# Patient Record
Sex: Female | Born: 1965
Health system: Southern US, Community
[De-identification: ages and names within clinical notes are randomized; demographics above are authoritative.]

## PROBLEM LIST (undated history)

## (undated) DIAGNOSIS — T8859XA Other complications of anesthesia, initial encounter: Secondary | ICD-10-CM

## (undated) DIAGNOSIS — B001 Herpesviral vesicular dermatitis: Secondary | ICD-10-CM

## (undated) DIAGNOSIS — R002 Palpitations: Secondary | ICD-10-CM

## (undated) DIAGNOSIS — G43109 Migraine with aura, not intractable, without status migrainosus: Secondary | ICD-10-CM

## (undated) DIAGNOSIS — F909 Attention-deficit hyperactivity disorder, unspecified type: Secondary | ICD-10-CM

## (undated) DIAGNOSIS — N63 Unspecified lump in unspecified breast: Secondary | ICD-10-CM

## (undated) DIAGNOSIS — F329 Major depressive disorder, single episode, unspecified: Secondary | ICD-10-CM

## (undated) DIAGNOSIS — F1011 Alcohol abuse, in remission: Secondary | ICD-10-CM

## (undated) DIAGNOSIS — I82890 Acute embolism and thrombosis of other specified veins: Secondary | ICD-10-CM

## (undated) DIAGNOSIS — R200 Anesthesia of skin: Secondary | ICD-10-CM

## (undated) DIAGNOSIS — T4145XA Adverse effect of unspecified anesthetic, initial encounter: Secondary | ICD-10-CM

## (undated) DIAGNOSIS — J189 Pneumonia, unspecified organism: Secondary | ICD-10-CM

## (undated) DIAGNOSIS — G250 Essential tremor: Secondary | ICD-10-CM

## (undated) DIAGNOSIS — R51 Headache: Secondary | ICD-10-CM

## (undated) DIAGNOSIS — G459 Transient cerebral ischemic attack, unspecified: Secondary | ICD-10-CM

## (undated) DIAGNOSIS — R32 Unspecified urinary incontinence: Secondary | ICD-10-CM

## (undated) DIAGNOSIS — T7840XA Allergy, unspecified, initial encounter: Secondary | ICD-10-CM

## (undated) DIAGNOSIS — F419 Anxiety disorder, unspecified: Secondary | ICD-10-CM

## (undated) DIAGNOSIS — F32A Depression, unspecified: Secondary | ICD-10-CM

## (undated) DIAGNOSIS — E785 Hyperlipidemia, unspecified: Secondary | ICD-10-CM

## (undated) DIAGNOSIS — D219 Benign neoplasm of connective and other soft tissue, unspecified: Secondary | ICD-10-CM

## (undated) DIAGNOSIS — R519 Headache, unspecified: Secondary | ICD-10-CM

## (undated) DIAGNOSIS — R21 Rash and other nonspecific skin eruption: Secondary | ICD-10-CM

## (undated) DIAGNOSIS — N939 Abnormal uterine and vaginal bleeding, unspecified: Secondary | ICD-10-CM

## (undated) HISTORY — PX: BREAST SURGERY: SHX581

## (undated) HISTORY — DX: Abnormal uterine and vaginal bleeding, unspecified: N93.9

## (undated) HISTORY — DX: Herpesviral vesicular dermatitis: B00.1

## (undated) HISTORY — PX: OTHER SURGICAL HISTORY: SHX169

## (undated) HISTORY — PX: NASAL SEPTUM SURGERY: SHX37

## (undated) HISTORY — DX: Migraine with aura, not intractable, without status migrainosus: G43.109

## (undated) HISTORY — DX: Anxiety disorder, unspecified: F41.9

## (undated) HISTORY — DX: Unspecified urinary incontinence: R32

## (undated) HISTORY — PX: TONSILLECTOMY: SUR1361

## (undated) HISTORY — PX: LIPOMA EXCISION: SHX5283

## (undated) HISTORY — DX: Allergy, unspecified, initial encounter: T78.40XA

## (undated) HISTORY — PX: BREAST BIOPSY: SHX20

## (undated) HISTORY — DX: Benign neoplasm of connective and other soft tissue, unspecified: D21.9

## (undated) HISTORY — PX: COLONOSCOPY: SHX174

## (undated) HISTORY — PX: BREAST CYST ASPIRATION: SHX578

---
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
## 2014-03-20 ENCOUNTER — Ambulatory Visit: Payer: Self-pay

## 2014-11-07 DIAGNOSIS — J189 Pneumonia, unspecified organism: Secondary | ICD-10-CM

## 2014-11-07 HISTORY — DX: Pneumonia, unspecified organism: J18.9

## 2015-04-15 ENCOUNTER — Other Ambulatory Visit: Payer: Self-pay

## 2015-04-15 DIAGNOSIS — R2232 Localized swelling, mass and lump, left upper limb: Secondary | ICD-10-CM

## 2015-04-16 ENCOUNTER — Ambulatory Visit
Admission: RE | Admit: 2015-04-16 | Discharge: 2015-04-16 | Disposition: A | Discharge status: Home / Self Care | Payer: 59 | Source: Ambulatory Visit | Attending: General Surgery | Admitting: General Surgery

## 2015-04-16 ENCOUNTER — Other Ambulatory Visit: Payer: Self-pay | Admitting: *Deleted

## 2015-04-16 DIAGNOSIS — R2232 Localized swelling, mass and lump, left upper limb: Secondary | ICD-10-CM | POA: Insufficient documentation

## 2015-04-22 ENCOUNTER — Other Ambulatory Visit: Payer: Self-pay

## 2015-04-22 ENCOUNTER — Ambulatory Visit: Payer: Self-pay | Admitting: Surgery

## 2015-04-22 ENCOUNTER — Other Ambulatory Visit: Payer: Self-pay | Admitting: *Deleted

## 2015-04-22 DIAGNOSIS — R2232 Localized swelling, mass and lump, left upper limb: Secondary | ICD-10-CM

## 2015-04-29 ENCOUNTER — Ambulatory Visit
Admission: RE | Admit: 2015-04-29 | Discharge: 2015-04-29 | Disposition: A | Discharge status: Home / Self Care | Payer: 59 | Source: Ambulatory Visit | Attending: General Surgery | Admitting: General Surgery

## 2015-04-29 DIAGNOSIS — R2232 Localized swelling, mass and lump, left upper limb: Secondary | ICD-10-CM | POA: Diagnosis present

## 2015-04-29 DIAGNOSIS — N63 Unspecified lump in breast: Secondary | ICD-10-CM | POA: Insufficient documentation

## 2015-04-29 DIAGNOSIS — D179 Benign lipomatous neoplasm, unspecified: Secondary | ICD-10-CM | POA: Diagnosis not present

## 2015-04-29 DIAGNOSIS — M19012 Primary osteoarthritis, left shoulder: Secondary | ICD-10-CM | POA: Insufficient documentation

## 2015-04-29 MED ORDER — GADOBENATE DIMEGLUMINE 529 MG/ML IV SOLN
15.0000 mL | Freq: Once | INTRAVENOUS | Status: AC | PRN
  Administered 2015-04-29: 11 mL via INTRAVENOUS

## 2015-05-06 ENCOUNTER — Encounter (HOSPITAL_COMMUNITY)
Admission: RE | Admit: 2015-05-06 | Discharge: 2015-05-06 | Disposition: A | Discharge status: Home / Self Care | Payer: 59 | Source: Ambulatory Visit | Attending: General Surgery | Admitting: General Surgery

## 2015-05-06 ENCOUNTER — Encounter (HOSPITAL_COMMUNITY): Payer: Self-pay

## 2015-05-06 ENCOUNTER — Other Ambulatory Visit (HOSPITAL_COMMUNITY): Payer: Self-pay | Admitting: *Deleted

## 2015-05-06 DIAGNOSIS — Z79899 Other long term (current) drug therapy: Secondary | ICD-10-CM | POA: Diagnosis not present

## 2015-05-06 DIAGNOSIS — R2232 Localized swelling, mass and lump, left upper limb: Secondary | ICD-10-CM | POA: Diagnosis present

## 2015-05-06 DIAGNOSIS — D1722 Benign lipomatous neoplasm of skin and subcutaneous tissue of left arm: Secondary | ICD-10-CM | POA: Diagnosis not present

## 2015-05-06 DIAGNOSIS — G43909 Migraine, unspecified, not intractable, without status migrainosus: Secondary | ICD-10-CM | POA: Diagnosis not present

## 2015-05-06 DIAGNOSIS — D171 Benign lipomatous neoplasm of skin and subcutaneous tissue of trunk: Secondary | ICD-10-CM | POA: Diagnosis not present

## 2015-05-06 DIAGNOSIS — F329 Major depressive disorder, single episode, unspecified: Secondary | ICD-10-CM | POA: Diagnosis not present

## 2015-05-06 HISTORY — DX: Adverse effect of unspecified anesthetic, initial encounter: T41.45XA

## 2015-05-06 HISTORY — DX: Pneumonia, unspecified organism: J18.9

## 2015-05-06 HISTORY — DX: Other complications of anesthesia, initial encounter: T88.59XA

## 2015-05-06 HISTORY — DX: Major depressive disorder, single episode, unspecified: F32.9

## 2015-05-06 HISTORY — DX: Alcohol abuse, in remission: F10.11

## 2015-05-06 HISTORY — DX: Attention-deficit hyperactivity disorder, unspecified type: F90.9

## 2015-05-06 HISTORY — DX: Headache, unspecified: R51.9

## 2015-05-06 HISTORY — DX: Headache: R51

## 2015-05-06 HISTORY — DX: Depression, unspecified: F32.A

## 2015-05-06 LAB — CBC
HCT: 39.6 % (ref 36.0–46.0)
HEMOGLOBIN: 13.3 g/dL (ref 12.0–15.0)
MCH: 31.4 pg (ref 26.0–34.0)
MCHC: 33.6 g/dL (ref 30.0–36.0)
MCV: 93.6 fL (ref 78.0–100.0)
PLATELETS: 201 10*3/uL (ref 150–400)
RBC: 4.23 MIL/uL (ref 3.87–5.11)
RDW: 12.6 % (ref 11.5–15.5)
WBC: 3.4 10*3/uL — AB (ref 4.0–10.5)

## 2015-05-06 LAB — HCG, SERUM, QUALITATIVE: Preg, Serum: NEGATIVE

## 2015-05-06 NOTE — Pre-Procedure Instructions (Signed)
Jessica Dodson  05/06/2015     Your procedure is scheduled on Friday, May 08, 2015 at 11:15 AM.   Report to [facility] Entrance "A" Admitting Office at 9:15 AM.   Call this number if you have problems the morning of surgery: [phone]     Remember:  Do not eat food or drink liquids after midnight Thursday, 05/07/15.  Take these medicines the morning of surgery with A SIP OF WATER: None   Do not wear jewelry, make-up or nail polish.  Do not wear lotions, powders, or perfumes.  You may NOT wear deodorant.  Do not shave 48 hours prior to surgery.    Do not bring valuables to the hospital.  [facility] is not responsible for any belongings or valuables.  Contacts, dentures or bridgework may not be worn into surgery.  Leave your suitcase in the car.  After surgery it may be brought to your room.  For patients admitted to the hospital, discharge time will be determined by your treatment team.  Patients discharged the day of surgery will not be allowed to drive home.   Special instructions:  [place] - Preparing for Surgery  Before surgery, you can play an important role.  Because skin is not sterile, your skin needs to be as free of germs as possible.  You can reduce the number of germs on you skin by washing with CHG (chlorahexidine gluconate) soap before surgery.  CHG is an antiseptic cleaner which kills germs and bonds with the skin to continue killing germs even after washing.  Please DO NOT use if you have an allergy to CHG or antibacterial soaps.  If your skin becomes reddened/irritated stop using the CHG and inform your nurse when you arrive at Short Stay.  Do not shave (including legs and underarms) for at least 48 hours prior to the first CHG shower.  You may shave your face.  Please follow these instructions carefully:   1.  Shower with CHG Soap the night before surgery and the                                morning of Surgery.  2.  If you choose to wash  your hair, wash your hair first as usual with your       normal shampoo.  3.  After you shampoo, rinse your hair and body thoroughly to remove the                      Shampoo.  4.  Use CHG as you would any other liquid soap.  You can apply chg directly       to the skin and wash gently with scrungie or a clean washcloth.  5.  Apply the CHG Soap to your body ONLY FROM THE NECK DOWN.        Do not use on open wounds or open sores.  Avoid contact with your eyes, ears, mouth and genitals (private parts).  Wash genitals (private parts) with your normal soap.  6.  Wash thoroughly, paying special attention to the area where your surgery        will be performed.  7.  Thoroughly rinse your body with warm water from the neck down.  8.  DO NOT shower/wash with your normal soap after using and rinsing off       the CHG Soap.  9.  Pat yourself dry with a clean towel.            10.  Wear clean pajamas.            11.  Place clean sheets on your bed the night of your first shower and do not        sleep with pets.  Day of Surgery  Do not apply any lotions/deodorants the morning of surgery.  Please wear clean clothes to the hospital.    Please read over the following fact sheets that you were given. Pain Booklet, Coughing and Deep Breathing and Surgical Site Infection Prevention

## 2015-05-06 NOTE — Progress Notes (Signed)
Called Dr. Johney Frame office to request pre-op orders, spoke with Sunday Spillers.

## 2015-05-07 ENCOUNTER — Ambulatory Visit: Payer: Self-pay | Admitting: General Surgery

## 2015-05-07 MED ORDER — CEFAZOLIN SODIUM-DEXTROSE 2-3 GM-% IV SOLR
2.0000 g | INTRAVENOUS | Status: AC
Start: 2015-05-08 — End: 2015-05-08
  Administered 2015-05-08: 2 g via INTRAVENOUS
  Filled 2015-05-07: qty 50

## 2015-05-07 NOTE — H&P (Signed)
History of Present Illness Jessica Ok MD; 04/15/2015 4:12 PM) Patient words: Evaluate mass on Back of Deltoid.  The patient is a 49 year old female who presents with a complaint of Mass. 49 year old female who self-referred for evaluation of a left posterior deltoid mass. Patient states that she noticed this approximately 2 weeks ago. She states it is becoming achy and states that she did not feel that in the past. Patient does state that she has lipomata left lower back area which has not grown in size and is been there for approximately 20 years.   Other Problems [name], [name]; 04/15/2015 4:05 PM) Alcohol Abuse Depression Lump In Breast Migraine Headache  Past Surgical History [name], [name]; 04/15/2015 4:05 PM) Breast Biopsy Right. Oral Surgery Tonsillectomy  Diagnostic Studies History [name], [name]; 04/15/2015 4:05 PM) Colonoscopy 1-5 years ago Mammogram within last year Pap Smear 1-5 years ago  Allergies [name], [name]; 04/15/2015 4:06 PM) No Known Drug Allergies06/06/2015  Medication History [name], [name]; 04/15/2015 4:07 PM) BuPROPion HCl ER (XL) (150MG  Tablet ER 24HR, Oral) Active. Citalopram Hydrobromide (10MG  Tablet, Oral) Active.  Social History [name], [name]; 04/15/2015 4:05 PM) Alcohol use Remotely quit alcohol use. Caffeine use Carbonated beverages, Coffee, Tea. No drug use Tobacco use Never smoker.  Family History [name], [name]; 04/15/2015 4:05 PM) Arthritis Mother. Breast Cancer Family Members In General. Cancer Family Members In General. Cerebrovascular Accident Family Members In [name] Members In General. Colon Polyps Father, Mother. Depression Family Members In General, Sister. Diabetes Mellitus Family Members In General. Hypertension Family Members In General, Father, Mother. Melanoma Family Members In General. Prostate Cancer Family Members In General, Father. Thyroid  problems Father.  Pregnancy / Birth History [name], [name]; 04/15/2015 4:05 PM) Age at menarche 45 years. Gravida 1 Maternal age 46-25 Para 0 Regular periods  Review of Systems [name]; 04/15/2015 4:06 PM) General Not Present- Appetite Loss, Chills, Fatigue, Fever, Night Sweats, Weight Gain and Weight Loss. Skin Not Present- Change in Wart/Mole, Dryness, Hives, Jaundice, New Lesions, Non-Healing Wounds, Rash and Ulcer. HEENT Present- Seasonal Allergies and Wears glasses/contact lenses. Not Present- Earache, Hearing Loss, Hoarseness, Nose Bleed, Oral Ulcers, Ringing in the Ears, Sinus Pain, Sore Throat, Visual Disturbances and Yellow Eyes. Respiratory Not Present- Bloody sputum, Chronic Cough, Difficulty Breathing, Snoring and Wheezing. Breast Not Present- Breast Mass, Breast Pain, Nipple Discharge and Skin Changes. Cardiovascular Not Present- Chest Pain, Difficulty Breathing Lying Down, Leg Cramps, Palpitations, Rapid Heart Rate, Shortness of Breath and Swelling of Extremities. Gastrointestinal Not Present- Abdominal Pain, Bloating, Bloody Stool, Change in Bowel Habits, Chronic diarrhea, Constipation, Difficulty Swallowing, Excessive gas, Gets full quickly at meals, Hemorrhoids, Indigestion, Nausea, Rectal Pain and Vomiting. Female Genitourinary Not Present- Frequency, Nocturia, Painful Urination, Pelvic Pain and Urgency. Musculoskeletal Not Present- Back Pain, Joint Pain, Joint Stiffness, Muscle Pain, Muscle Weakness and Swelling of Extremities. Neurological Not Present- Decreased Memory, Fainting, Headaches, Numbness, Seizures, Tingling, Tremor, Trouble walking and Weakness. Psychiatric Not Present- Anxiety, Bipolar, Change in Sleep Pattern, Depression, Fearful and Frequent crying. Endocrine Not Present- Cold Intolerance, Excessive Hunger, Hair Changes, Heat Intolerance, Hot flashes and New Diabetes. Hematology Not Present- Easy Bruising, Excessive bleeding, Gland problems,  HIV and Persistent Infections.   Vitals [name]; 04/15/2015 4:05 PM) 04/15/2015 4:05 PM Weight: 126.8 lb Height: 63in Body Surface Area: 1.6 m Body Mass Index: 22.46 kg/m Temp.: 97.66F(Temporal)  Pulse: 72 (Regular)  Resp.: 16 (Unlabored)  BP: 128/86 (Sitting, Left Arm,  Standard)    Physical Exam Jessica Ok MD; 04/15/2015 4:13 PM) General Mental Status-Alert. General Appearance-Consistent with stated age. Hydration-Well hydrated. Voice-Normal.  Cardiovascular Cardiovascular examination reveals -normal heart sounds, regular rate and rhythm with no murmurs and normal pedal pulses bilaterally.  Abdomen Inspection Inspection of the abdomen reveals - No Hernias. Skin - Scar - no surgical scars. Palpation/Percussion Palpation and Percussion of the abdomen reveal - Soft, Non Tender, No Rebound tenderness, No Rigidity (guarding) and No hepatosplenomegaly. Auscultation Auscultation of the abdomen reveals - Bowel sounds normal.  Musculoskeletal Note: Left posterior deltoid mass, mobile, spongy     Assessment & Plan Jessica Ok MD; 04/15/2015 4:14 PM) MASS OF SKIN OF SHOULDER, LEFT (782.2  R22.32) Impression: 49 year old female with a left posterior shoulder mass.  WE will proceed to the OR for e/o mass I discussed with the patient the risks and benefits of the procedure to include but not limited to: Infection, bleeding, damage to structures, possible recurrence.  The patient was understanding and wishes to proceed.

## 2015-05-08 ENCOUNTER — Ambulatory Visit (HOSPITAL_COMMUNITY)
Admission: RE | Admit: 2015-05-08 | Discharge: 2015-05-08 | Disposition: A | Discharge status: Home / Self Care | Payer: 59 | Source: Ambulatory Visit | Attending: General Surgery | Admitting: General Surgery

## 2015-05-08 ENCOUNTER — Ambulatory Visit (HOSPITAL_COMMUNITY): Payer: 59 | Admitting: Anesthesiology

## 2015-05-08 ENCOUNTER — Encounter (HOSPITAL_COMMUNITY)
Admission: RE | Disposition: A | Discharge status: Home / Self Care | Payer: Self-pay | Source: Ambulatory Visit | Attending: General Surgery

## 2015-05-08 ENCOUNTER — Encounter (HOSPITAL_COMMUNITY): Payer: Self-pay | Admitting: Surgery

## 2015-05-08 DIAGNOSIS — D1722 Benign lipomatous neoplasm of skin and subcutaneous tissue of left arm: Secondary | ICD-10-CM | POA: Insufficient documentation

## 2015-05-08 DIAGNOSIS — G43909 Migraine, unspecified, not intractable, without status migrainosus: Secondary | ICD-10-CM | POA: Insufficient documentation

## 2015-05-08 DIAGNOSIS — D171 Benign lipomatous neoplasm of skin and subcutaneous tissue of trunk: Secondary | ICD-10-CM | POA: Insufficient documentation

## 2015-05-08 DIAGNOSIS — Z79899 Other long term (current) drug therapy: Secondary | ICD-10-CM | POA: Insufficient documentation

## 2015-05-08 DIAGNOSIS — F329 Major depressive disorder, single episode, unspecified: Secondary | ICD-10-CM | POA: Insufficient documentation

## 2015-05-08 HISTORY — PX: MASS EXCISION: SHX2000

## 2015-05-08 SURGERY — EXCISION MASS
Anesthesia: General | Site: Arm Upper | Laterality: Left

## 2015-05-08 MED ORDER — MIDAZOLAM HCL 5 MG/5ML IJ SOLN
INTRAMUSCULAR | Status: DC | PRN
  Administered 2015-05-08: 2 mg via INTRAVENOUS

## 2015-05-08 MED ORDER — ONDANSETRON HCL 4 MG/2ML IJ SOLN
INTRAMUSCULAR | Status: AC
  Filled 2015-05-08: qty 2

## 2015-05-08 MED ORDER — HYDROMORPHONE HCL 1 MG/ML IJ SOLN: 0.2500 mg | INTRAMUSCULAR | Status: DC | PRN

## 2015-05-08 MED ORDER — BUPIVACAINE-EPINEPHRINE 0.25% -1:200000 IJ SOLN
INTRAMUSCULAR | Status: DC | PRN
  Administered 2015-05-08: 20 mL

## 2015-05-08 MED ORDER — 0.9 % SODIUM CHLORIDE (POUR BTL) OPTIME
TOPICAL | Status: DC | PRN
  Administered 2015-05-08: 1000 mL

## 2015-05-08 MED ORDER — PROMETHAZINE HCL 25 MG/ML IJ SOLN: 6.2500 mg | INTRAMUSCULAR | Status: DC | PRN

## 2015-05-08 MED ORDER — LIDOCAINE HCL (CARDIAC) 20 MG/ML IV SOLN
INTRAVENOUS | Status: AC
  Filled 2015-05-08: qty 5

## 2015-05-08 MED ORDER — CHLORHEXIDINE GLUCONATE 4 % EX LIQD
1.0000 | Freq: Once | CUTANEOUS | Status: DC
Start: 2015-05-09 — End: 2015-05-08

## 2015-05-08 MED ORDER — FENTANYL CITRATE (PF) 100 MCG/2ML IJ SOLN
INTRAMUSCULAR | Status: DC | PRN
  Administered 2015-05-08 (×2): 50 ug via INTRAVENOUS

## 2015-05-08 MED ORDER — LIDOCAINE HCL (CARDIAC) 20 MG/ML IV SOLN
INTRAVENOUS | Status: DC | PRN
  Administered 2015-05-08: 100 mg via INTRAVENOUS

## 2015-05-08 MED ORDER — PROPOFOL 10 MG/ML IV BOLUS
INTRAVENOUS | Status: AC
  Filled 2015-05-08: qty 20

## 2015-05-08 MED ORDER — OXYCODONE-ACETAMINOPHEN 5-325 MG PO TABS: 1.0000 | ORAL_TABLET | ORAL | Status: DC | PRN

## 2015-05-08 MED ORDER — LACTATED RINGERS IV SOLN
INTRAVENOUS | Status: DC
  Administered 2015-05-08 (×2): via INTRAVENOUS

## 2015-05-08 MED ORDER — ARTIFICIAL TEARS OP OINT
TOPICAL_OINTMENT | OPHTHALMIC | Status: AC
  Filled 2015-05-08: qty 3.5

## 2015-05-08 MED ORDER — ONDANSETRON HCL 4 MG/2ML IJ SOLN
INTRAMUSCULAR | Status: DC | PRN
Start: 2015-05-08 — End: 2015-05-08
  Administered 2015-05-08: 4 mg via INTRAVENOUS

## 2015-05-08 MED ORDER — FENTANYL CITRATE (PF) 250 MCG/5ML IJ SOLN
INTRAMUSCULAR | Status: AC
  Filled 2015-05-08: qty 5

## 2015-05-08 MED ORDER — PROPOFOL 10 MG/ML IV BOLUS
INTRAVENOUS | Status: DC | PRN
  Administered 2015-05-08: 200 mg via INTRAVENOUS

## 2015-05-08 MED ORDER — MIDAZOLAM HCL 2 MG/2ML IJ SOLN
INTRAMUSCULAR | Status: AC
  Filled 2015-05-08: qty 2

## 2015-05-08 SURGICAL SUPPLY — 34 items
CHLORAPREP W/TINT 26ML (MISCELLANEOUS) ×2 IMPLANT
COVER SURGICAL LIGHT HANDLE (MISCELLANEOUS) ×2 IMPLANT
DRAPE PED LAPAROTOMY (DRAPES) ×2 IMPLANT
ELECT CAUTERY BLADE 6.4 (BLADE) ×2 IMPLANT
ELECT REM PT RETURN 9FT ADLT (ELECTROSURGICAL) ×2
ELECTRODE REM PT RTRN 9FT ADLT (ELECTROSURGICAL) ×1 IMPLANT
GLOVE BIO SURGEON STRL SZ7 (GLOVE) ×2 IMPLANT
GLOVE BIO SURGEON STRL SZ7.5 (GLOVE) ×2 IMPLANT
GLOVE BIOGEL PI IND STRL 6.5 (GLOVE) ×1 IMPLANT
GLOVE BIOGEL PI IND STRL 7.0 (GLOVE) ×2 IMPLANT
GLOVE BIOGEL PI IND STRL 8 (GLOVE) ×1 IMPLANT
GLOVE BIOGEL PI INDICATOR 6.5 (GLOVE) ×1
GLOVE BIOGEL PI INDICATOR 7.0 (GLOVE) ×2
GLOVE BIOGEL PI INDICATOR 8 (GLOVE) ×1
GLOVE SURG SS PI 7.0 STRL IVOR (GLOVE) ×4 IMPLANT
GOWN STRL REUS W/ TWL LRG LVL3 (GOWN DISPOSABLE) ×1 IMPLANT
GOWN STRL REUS W/ TWL XL LVL3 (GOWN DISPOSABLE) ×1 IMPLANT
GOWN STRL REUS W/TWL LRG LVL3 (GOWN DISPOSABLE) ×1
GOWN STRL REUS W/TWL XL LVL3 (GOWN DISPOSABLE) ×1
KIT BASIN OR (CUSTOM PROCEDURE TRAY) ×2 IMPLANT
KIT ROOM TURNOVER OR (KITS) ×2 IMPLANT
LIQUID BAND (GAUZE/BANDAGES/DRESSINGS) ×2 IMPLANT
NEEDLE HYPO 25GX1X1/2 BEV (NEEDLE) ×2 IMPLANT
NS IRRIG 1000ML POUR BTL (IV SOLUTION) ×2 IMPLANT
PACK SURGICAL SETUP 50X90 (CUSTOM PROCEDURE TRAY) ×2 IMPLANT
PAD ARMBOARD 7.5X6 YLW CONV (MISCELLANEOUS) ×2 IMPLANT
PENCIL BUTTON HOLSTER BLD 10FT (ELECTRODE) ×2 IMPLANT
SPECIMEN JAR SMALL (MISCELLANEOUS) ×2 IMPLANT
SPONGE LAP 18X18 X RAY DECT (DISPOSABLE) ×2 IMPLANT
SUT MNCRL AB 4-0 PS2 18 (SUTURE) ×2 IMPLANT
SUT VIC AB 3-0 SH 18 (SUTURE) ×2 IMPLANT
SYR CONTROL 10ML LL (SYRINGE) ×2 IMPLANT
TOWEL OR 17X26 10 PK STRL BLUE (TOWEL DISPOSABLE) ×2 IMPLANT
TUBE CONNECTING 12X1/4 (SUCTIONS) ×2 IMPLANT

## 2015-05-08 NOTE — Progress Notes (Signed)
   05/08/15 1000  Clinical Encounter Type  Visited With Patient and family together  Visit Type Initial;Pre-op  Advance Directives (For Healthcare)  Does patient have an advance directive? Yes  Type of Paramedic of [place];Living will   Chaplain was paged to patient's room this morning to help facilitate the completion of the patient's advanced directive. When chaplain arrived, patient was accompanied by husband and had completed the advanced directive correctly. Chaplain facilitated finalizing and signing of document. Patient now has an advanced directive, including healthcare power of attorney and living will on file. Chaplain also provided copies for the patient and patient's family.  Gar Ponto, Chaplain  10:43 AM

## 2015-05-08 NOTE — Anesthesia Postprocedure Evaluation (Signed)
Anesthesia Post Note  Patient: Jessica Dodson  Procedure(s) Performed: Procedure(s) (LRB): EXCISION OF LEFT DELTOID MASS (Left)  Anesthesia type: general  Patient location: PACU  Post pain: Pain level controlled  Post assessment: Patient's Cardiovascular Status Stable  Last Vitals:  Filed Vitals:   05/08/15 1330  BP: 126/69  Pulse: 51  Temp:   Resp: 14    Post vital signs: Reviewed and stable  Level of consciousness: sedated  Complications: No apparent anesthesia complications

## 2015-05-08 NOTE — Discharge Instructions (Signed)
Incision Care An incision is when a surgeon cuts into your body tissues. After surgery, the incision needs to be cared for properly to prevent infection.  HOME CARE INSTRUCTIONS   Take all medicine as directed by your caregiver. Only take over-the-counter or prescription medicines for pain, discomfort, or fever as directed by your caregiver.  Do not remove your bandage (dressing) or get your incision wet until your surgeon gives you permission. In the event that your dressing becomes wet, dirty, or starts to smell, change the dressing and call your surgeon for instructions as soon as possible.  Take showers.  Resume your normal diet and activities as directed or allowed.  Avoid lifting any weight until you are instructed otherwise.  Use anti-itch antihistamine medicine as directed by your caregiver. The wound may itch when it is healing. Do not pick or scratch at the wound.  Follow up with your caregiver for stitch (suture) or staple removal as directed.  Drink enough fluids to keep your urine clear or pale yellow. SEEK MEDICAL CARE IF:   You have redness, swelling, or increasing pain in the wound that is not controlled with medicine.  You have drainage, blood, or pus coming from the wound that lasts longer than 1 day.  You develop muscle aches, chills, or a general ill feeling.  You notice a bad smell coming from the wound or dressing.  Your wound edges separate after the sutures, staples, or skin adhesive strips have been removed.  You develop persistent nausea or vomiting. SEEK IMMEDIATE MEDICAL CARE IF:   You have a fever.  You develop a rash.  You develop dizzy episodes or faint while standing.  You have difficulty breathing.  You develop any reaction or side effects to medicine given. MAKE SURE YOU:   Understand these instructions.  Will watch your condition.  Will get help right away if you are not doing well or get worse. Document Released: 05/13/2005  Document Revised: 01/16/2012 Document Reviewed: 12/18/2013 [facility] Patient Information 2015 [place], [place]. This information is not intended to replace advice given to you by your health care provider. Make sure you discuss any questions you have with your health care provider.

## 2015-05-08 NOTE — Op Note (Signed)
05/08/2015  12:46 PM  PATIENT:  Jessica Dodson  49 y.o. female  PRE-OPERATIVE DIAGNOSIS:  LEFT DELTOID MASS  POST-OPERATIVE DIAGNOSIS:  LEFT DELTOID MASS - 6.25 x 4cm  PROCEDURE:  Procedure(s): EXCISION OF LEFT DELTOID MASS, SUBFASCIAL, 6.25 X 4CM (Left)  SURGEON:  Surgeon(s) and Role:    * [name], MD - Primary  ANESTHESIA:   local and general  EBL:   5cc  BLOOD ADMINISTERED:none  DRAINS: none   LOCAL MEDICATIONS USED:  BUPIVICAINE   SPECIMEN:  Source of Specimen:  Left shoulder mass 6.25 x 4cm sub fascial  DISPOSITION OF SPECIMEN:  PATHOLOGY  COUNTS:  YES  TOURNIQUET:  * No tourniquets in log *  DICTATION: .Dragon Dictation After the patient was consented she was taken back to the operating room and placed in supine position with bilateral SCDs in place. She was prepped and draped in the usual sterile fashion. A timeout was called all facts verified.  A 4 same incision was made just over the left deltoid muscle, over the area of the greatest bulge. Blood cutter used to maintain hemostasis and dissection was taken down to the deltoid fascia. This was incised. According to the MRI of the left deltoid mass was subfascial. At this time blunt dissection was taken down to the mass. The mass was visualized and seen to be fatty which correlated with MRI. This was circumferentially dissected away from the surrounding musculature. Once the entire mass was circumferentially dissected this was excised. The mass measured 6.25 x 4 cm.  At this time the area was irrigated out with sterile saline. The fascia was reapproximated using 3-0 Vicryl's in a figure-of-eight fashion. The deep dermal layer was reapproximated using 3-0 Vicryl's in interrupted fashion. The skin was reapproximated using 4-0 Monocryl subcuticular fashion. The area was infiltrated with percent bupivacaine. The wound was dressed with Liqui-Band.  All counts were reported as correct 2.  The patient tolerated the  procedure well, and was taken to the recovery room in stable condition.  PLAN OF CARE: Discharge to home after PACU  PATIENT DISPOSITION:  PACU - hemodynamically stable.   Delay start of Pharmacological VTE agent (>24hrs) due to surgical blood loss or risk of bleeding: not applicable

## 2015-05-08 NOTE — Interval H&P Note (Signed)
History and Physical Interval Note:  05/08/2015 7:47 AM  Jessica Dodson  has presented today for surgery, with the diagnosis of LEFT DELTOID MASS  The various methods of treatment have been discussed with the patient and family. After consideration of risks, benefits and other options for treatment, the patient has consented to  Procedure(s): EXCISION OF LEFT DELTOID MASS (Left) as a surgical intervention .  The patient's history has been reviewed, patient examined, no change in status, stable for surgery.  I have reviewed the patient's chart and labs.  Questions were answered to the patient's satisfaction.     Rosario Jacks., Anne Hahn

## 2015-05-08 NOTE — Anesthesia Procedure Notes (Signed)
Procedure Name: LMA Insertion Date/Time: 05/08/2015 11:59 AM Performed by: Scheryl Darter Pre-anesthesia Checklist: Patient identified, Emergency Drugs available, Suction available, Patient being monitored and Timeout performed Patient Re-evaluated:Patient Re-evaluated prior to inductionOxygen Delivery Method: Circle system utilized Preoxygenation: Pre-oxygenation with 100% oxygen Intubation Type: IV induction Ventilation: Mask ventilation without difficulty LMA: LMA inserted LMA Size: 4.0 Number of attempts: 1 Placement Confirmation: ETT inserted through vocal cords under direct vision,  positive ETCO2 and breath sounds checked- equal and bilateral Tube secured with: Tape

## 2015-05-08 NOTE — Transfer of Care (Signed)
Immediate Anesthesia Transfer of Care Note  Patient: Jessica Dodson  Procedure(s) Performed: Procedure(s): EXCISION OF LEFT DELTOID MASS (Left)  Patient Location: PACU  Anesthesia Type:General  Level of Consciousness: awake, alert , oriented and sedated  Airway & Oxygen Therapy: Patient Spontanous Breathing and Patient connected to nasal cannula oxygen  Post-op Assessment: Report given to RN, Post -op Vital signs reviewed and stable and Patient moving all extremities  Post vital signs: Reviewed and stable  Last Vitals:  Filed Vitals:   05/08/15 1256  BP: 112/55  Pulse: 86  Temp: 36.6 C  Resp: 15    Complications: No apparent anesthesia complications

## 2015-05-08 NOTE — H&P (View-Only) (Signed)
History of Present Illness Jessica Ok Jessica Dodson; 04/15/2015 4:12 PM) Patient words: Evaluate mass on Back of Deltoid.  The patient is a 49 year old female who presents with a complaint of Mass. 49 year old female who self-referred for evaluation of a left posterior deltoid mass. Patient states that she noticed this approximately 2 weeks ago. She states it is becoming achy and states that she did not feel that in the past. Patient does state that she has lipomata left lower back area which has not grown in size and is been there for approximately 20 years.   Other Problems Jessica Dodson, Jessica Dodson; 04/15/2015 4:05 PM) Alcohol Abuse Depression Lump In Breast Migraine Headache  Past Surgical History Jessica Dodson, Jessica Dodson; 04/15/2015 4:05 PM) Breast Biopsy Right. Oral Surgery Tonsillectomy  Diagnostic Studies History Jessica Dodson, Jessica Dodson; 04/15/2015 4:05 PM) Colonoscopy 1-5 years ago Mammogram within last year Pap Smear 1-5 years ago  Allergies Jessica Dodson, Jessica Dodson; 04/15/2015 4:06 PM) No Known Drug Allergies06/06/2015  Medication History Jessica Dodson, Jessica Dodson; 04/15/2015 4:07 PM) BuPROPion HCl ER (XL) (150MG  Tablet ER 24HR, Oral) Active. Citalopram Hydrobromide (10MG  Tablet, Oral) Active.  Social History Jessica Dodson, Jessica Dodson; 04/15/2015 4:05 PM) Alcohol use Remotely quit alcohol use. Caffeine use Carbonated beverages, Coffee, Tea. No drug use Tobacco use Never smoker.  Family History Jessica Dodson, Jessica Dodson; 04/15/2015 4:05 PM) Arthritis Mother. Breast Cancer Family Members In General. Cancer Family Members In General. Cerebrovascular Accident Family Members In [place] Members In General. Colon Polyps Father, Mother. Depression Family Members In General, Sister. Diabetes Mellitus Family Members In General. Hypertension Family Members In General, Father, Mother. Melanoma Family Members In General. Prostate Cancer Family Members In General, Father. Thyroid  problems Father.  Pregnancy / Birth History Jessica Dodson, Jessica Dodson; 04/15/2015 4:05 PM) Age at menarche 39 years. Gravida 1 Maternal age 44-25 Para 0 Regular periods  Review of Systems Jessica Dodson Jessica Dodson; 04/15/2015 4:06 PM) General Not Present- Appetite Loss, Chills, Fatigue, Fever, Night Sweats, Weight Gain and Weight Loss. Skin Not Present- Change in Wart/Mole, Dryness, Hives, Jaundice, New Lesions, Non-Healing Wounds, Rash and Ulcer. HEENT Present- Seasonal Allergies and Wears glasses/contact lenses. Not Present- Earache, Hearing Loss, Hoarseness, Nose Bleed, Oral Ulcers, Ringing in the Ears, Sinus Pain, Sore Throat, Visual Disturbances and Yellow Eyes. Respiratory Not Present- Bloody sputum, Chronic Cough, Difficulty Breathing, Snoring and Wheezing. Breast Not Present- Breast Mass, Breast Pain, Nipple Discharge and Skin Changes. Cardiovascular Not Present- Chest Pain, Difficulty Breathing Lying Down, Leg Cramps, Palpitations, Rapid Heart Rate, Shortness of Breath and Swelling of Extremities. Gastrointestinal Not Present- Abdominal Pain, Bloating, Bloody Stool, Change in Bowel Habits, Chronic diarrhea, Constipation, Difficulty Swallowing, Excessive gas, Gets full quickly at meals, Hemorrhoids, Indigestion, Nausea, Rectal Pain and Vomiting. Female Genitourinary Not Present- Frequency, Nocturia, Painful Urination, Pelvic Pain and Urgency. Musculoskeletal Not Present- Back Pain, Joint Pain, Joint Stiffness, Muscle Pain, Muscle Weakness and Swelling of Extremities. Neurological Not Present- Decreased Memory, Fainting, Headaches, Numbness, Seizures, Tingling, Tremor, Trouble walking and Weakness. Psychiatric Not Present- Anxiety, Bipolar, Change in Sleep Pattern, Depression, Fearful and Frequent crying. Endocrine Not Present- Cold Intolerance, Excessive Hunger, Hair Changes, Heat Intolerance, Hot flashes and New Diabetes. Hematology Not Present- Easy Bruising, Excessive bleeding, Gland problems,  HIV and Persistent Infections.   Vitals Jessica Dodson Jessica Dodson; 04/15/2015 4:05 PM) 04/15/2015 4:05 PM Weight: 126.8 lb Height: 63in Body Surface Area: 1.6 m Body Mass Index: 22.46 kg/m Temp.: 97.12F(Temporal)  Pulse: 72 (Regular)  Resp.: 16 (Unlabored)  BP: 128/86 (Sitting, Left Arm,  Standard)    Physical Exam Jessica Ok Jessica Dodson; 04/15/2015 4:13 PM) General Mental Status-Alert. General Appearance-Consistent with stated age. Hydration-Well hydrated. Voice-Normal.  Cardiovascular Cardiovascular examination reveals -normal heart sounds, regular rate and rhythm with no murmurs and normal pedal pulses bilaterally.  Abdomen Inspection Inspection of the abdomen reveals - No Hernias. Skin - Scar - no surgical scars. Palpation/Percussion Palpation and Percussion of the abdomen reveal - Soft, Non Tender, No Rebound tenderness, No Rigidity (guarding) and No hepatosplenomegaly. Auscultation Auscultation of the abdomen reveals - Bowel sounds normal.  Musculoskeletal Note: Left posterior deltoid mass, mobile, spongy     Assessment & Plan Jessica Ok Jessica Dodson; 04/15/2015 4:14 PM) MASS OF SKIN OF SHOULDER, LEFT (782.2  R22.32) Impression: 49 year old female with a left posterior shoulder mass.  WE will proceed to the OR for e/o mass I discussed with the patient the risks and benefits of the procedure to include but not limited to: Infection, bleeding, damage to structures, possible recurrence.  The patient was understanding and wishes to proceed.

## 2015-05-12 ENCOUNTER — Encounter (HOSPITAL_COMMUNITY): Payer: Self-pay | Admitting: General Surgery

## 2015-06-30 ENCOUNTER — Encounter: Payer: Self-pay | Admitting: Primary Care

## 2015-06-30 ENCOUNTER — Ambulatory Visit (INDEPENDENT_AMBULATORY_CARE_PROVIDER_SITE_OTHER): Payer: 59 | Admitting: Primary Care

## 2015-06-30 ENCOUNTER — Encounter (INDEPENDENT_AMBULATORY_CARE_PROVIDER_SITE_OTHER): Payer: Self-pay

## 2015-06-30 VITALS — BP 114/70 | HR 61 | Temp 98.1°F | Ht 65.0 in | Wt 124.4 lb

## 2015-06-30 DIAGNOSIS — F419 Anxiety disorder, unspecified: Secondary | ICD-10-CM

## 2015-06-30 DIAGNOSIS — F32A Depression, unspecified: Secondary | ICD-10-CM | POA: Insufficient documentation

## 2015-06-30 DIAGNOSIS — F329 Major depressive disorder, single episode, unspecified: Secondary | ICD-10-CM | POA: Diagnosis not present

## 2015-06-30 HISTORY — DX: Depression, unspecified: F32.A

## 2015-06-30 MED ORDER — ESCITALOPRAM OXALATE 10 MG PO TABS: 10.0000 mg | ORAL_TABLET | Freq: Every day | ORAL | Status: DC

## 2015-06-30 NOTE — Progress Notes (Signed)
Subjective:    Patient ID: Jessica Dodson, female    DOB: [birth year], 49 y.o.   MRN: [id number]  HPI  Jessica Dodson is a 49 year old female who presents today to establish care and discuss the problems mentioned below. Will obtain old records. Last physical was in the Fall of 2015.  1) Depression: Diagnosed in college and hospitalized in medical school. She was once on paxil, then switched to lexapro 10 years ago. She was following with PCP who is no longer in network. Denies side effects of the medication, denies SI/HI. Has been sober for 23 years from alcohol.   Review of Systems  Constitutional: Negative for unexpected weight change.  HENT: Negative for rhinorrhea.   Respiratory: Negative for cough and shortness of breath.   Cardiovascular: Negative for chest pain.  Gastrointestinal: Negative for diarrhea and constipation.  Genitourinary: Negative for difficulty urinating.       Periods regular.  Musculoskeletal: Negative for myalgias and arthralgias.       Sees Dr. Ricki Miller in [place] (orthopedist)   Skin: Negative for rash.  Neurological: Negative for dizziness and headaches.       Past Medical History  Diagnosis Date  . Allergy   . Migraine with aura   . Depression     Social History   Social History  . Marital Status: Married    Spouse Name: N/A  . Number of Children: N/A  . Years of Education: N/A   Occupational History  . Not on file.   Social History Main Topics  . Smoking status: Never Smoker   . Smokeless tobacco: Not on file  . Alcohol Use: No  . Drug Use: Not on file  . Sexual Activity: Not on file   Other Topics Concern  . Not on file   Social History Narrative   Married.   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    Live in [place].    Enjoys running, mountain bike, kayak.     Past Surgical History  Procedure Laterality Date  . Lipoma excision Left     6 cm tumor    Family History  Problem Relation Age of Onset  .  Arthritis Mother   . Hyperlipidemia Mother   . Hypertension Mother   . Prostate cancer Father   . Hyperlipidemia Father   . Hypertension Father   . Prostate cancer Maternal Uncle   . Colon cancer Maternal Uncle   . Breast cancer Paternal Aunt   . Diabetes Paternal Uncle   . Arthritis Maternal Grandmother   . Stroke Maternal Grandmother   . Hypertension Maternal Grandmother   . Mental illness Maternal Grandmother   . Hypertension Maternal Grandfather   . Hyperlipidemia Paternal Grandmother   . Stroke Paternal Grandmother   . Prostate cancer Paternal Grandfather   . Hyperlipidemia Paternal Grandfather   . Heart disease Paternal Grandfather   . Hypertension Paternal Grandfather   . Prostate cancer Maternal Uncle   . Breast cancer Paternal Aunt     No Known Allergies  No current outpatient prescriptions on file prior to visit.   No current facility-administered medications on file prior to visit.    BP 114/70 mmHg  Pulse 61  Temp(Src) 98.1 F (36.7 C) (Oral)  Ht 5\' 5"  (1.651 m)  Wt 124 lb 6.4 oz (56.427 kg)  BMI 20.70 kg/m2  SpO2 98%  LMP 06/08/2015    Objective:   Physical Exam  Constitutional:  She is oriented to person, place, and time. She appears well-nourished.  Cardiovascular: Normal rate and regular rhythm.   Pulmonary/Chest: Effort normal and breath sounds normal.  Neurological: She is alert and oriented to person, place, and time.  Skin: Skin is warm and dry.  Psychiatric: She has a normal mood and affect.          Assessment & Plan:

## 2015-06-30 NOTE — Patient Instructions (Signed)
Please schedule a physical with me in the next 3 months. You will also schedule a lab only appointment one week prior. We will discuss your lab results during your physical.  Refills have been sent for your Lexapro.  It was a pleasure to meet you today! Please don't hesitate to call me with any questions. Welcome to [facility]!

## 2015-06-30 NOTE — Assessment & Plan Note (Signed)
Longstanding history since college years. Managed on Lexapro 10 mg for the past 10 years. Denies side effects, SI/HI, and feels well managed. Refills provided today. Will continue to monitor.

## 2015-07-08 ENCOUNTER — Encounter (HOSPITAL_COMMUNITY): Payer: Self-pay | Admitting: General Surgery

## 2015-09-15 ENCOUNTER — Other Ambulatory Visit: Payer: Self-pay | Admitting: Primary Care

## 2015-09-15 DIAGNOSIS — Z0000 Encounter for general adult medical examination without abnormal findings: Secondary | ICD-10-CM

## 2015-09-15 DIAGNOSIS — E538 Deficiency of other specified B group vitamins: Secondary | ICD-10-CM

## 2015-09-22 ENCOUNTER — Other Ambulatory Visit (INDEPENDENT_AMBULATORY_CARE_PROVIDER_SITE_OTHER): Payer: 59

## 2015-09-22 DIAGNOSIS — Z0000 Encounter for general adult medical examination without abnormal findings: Secondary | ICD-10-CM | POA: Diagnosis not present

## 2015-09-22 DIAGNOSIS — E538 Deficiency of other specified B group vitamins: Secondary | ICD-10-CM

## 2015-09-22 LAB — COMPREHENSIVE METABOLIC PANEL
ALBUMIN: 4.4 g/dL (ref 3.5–5.2)
ALT: 11 U/L (ref 0–35)
AST: 16 U/L (ref 0–37)
Alkaline Phosphatase: 52 U/L (ref 39–117)
BILIRUBIN TOTAL: 0.9 mg/dL (ref 0.2–1.2)
BUN: 10 mg/dL (ref 6–23)
CALCIUM: 9.1 mg/dL (ref 8.4–10.5)
CHLORIDE: 101 meq/L (ref 96–112)
CO2: 29 mEq/L (ref 19–32)
CREATININE: 0.63 mg/dL (ref 0.40–1.20)
GFR: 107.04 mL/min (ref >60.00)
Glucose, Bld: 86 mg/dL (ref 70–99)
Potassium: 3.8 mEq/L (ref 3.5–5.1)
Sodium: 137 mEq/L (ref 135–145)
Total Protein: 7.4 g/dL (ref 6.0–8.3)

## 2015-09-22 LAB — LIPID PANEL
CHOLESTEROL: 152 mg/dL (ref 0–200)
HDL: 60.2 mg/dL (ref >39.00)
LDL Cholesterol: 81 mg/dL (ref 0–99)
NonHDL: 92.05
TRIGLYCERIDES: 53 mg/dL (ref 0.0–149.0)
Total CHOL/HDL Ratio: 3
VLDL: 10.6 mg/dL (ref 0.0–40.0)

## 2015-09-22 LAB — VITAMIN B12: VITAMIN B 12: 261 pg/mL (ref 211–911)

## 2015-09-22 LAB — VITAMIN D 25 HYDROXY (VIT D DEFICIENCY, FRACTURES): VITD: 32.88 ng/mL (ref 30.00–100.00)

## 2015-09-29 ENCOUNTER — Encounter: Payer: 59 | Admitting: Primary Care

## 2015-10-12 ENCOUNTER — Encounter: Payer: Self-pay | Admitting: Primary Care

## 2015-10-12 ENCOUNTER — Ambulatory Visit (INDEPENDENT_AMBULATORY_CARE_PROVIDER_SITE_OTHER): Payer: 59 | Admitting: Primary Care

## 2015-10-12 VITALS — BP 122/78 | HR 59 | Temp 97.3°F | Ht 65.0 in | Wt 126.1 lb

## 2015-10-12 DIAGNOSIS — N63 Unspecified lump in unspecified breast: Secondary | ICD-10-CM

## 2015-10-12 HISTORY — DX: Unspecified lump in unspecified breast: N63.0

## 2015-10-12 NOTE — Assessment & Plan Note (Addendum)
Present x 1 month, increase in size and tenderness Located to left breast at 2-3 o'clock position. No changes in skin texture, nipple appears WNL. Right WNL. Firm, immobile, non tender. Will order diagnostic mammogram with [place] to left breast. Anticipate biopsy will be needed, she would like to go to her specialist in [place].

## 2015-10-12 NOTE — Progress Notes (Signed)
Subjective:    Patient ID: Jessica Dodson, female    DOB: 1966-04-15, 49 y.o.   MRN: [id number]  HPI  Jessica Dodson is a 49 year old female who presents today with a chief complaint of breast mass. Her mass is located to the left breast and is the size of a marble. She reports tenderness. She first noticed the mass 1 month ago and has since increased in tenderness and size. She placed a needle into her mass without drainage. She's had a history of cysts to her breasts prior and last diagnotic mammogram was in October 2015. Denies skin texture changes, erythema, nipple inversion, skin dimpling.   Review of Systems  Constitutional: Negative for fever.  Respiratory:       Left breast mass and tenderness  Skin: Negative for color change and rash.       Past Medical History  Diagnosis Date  . Pneumonia 2016  . Depression     recurrent, not on meds at this time  . Headache     migraine (once a year) usually has an aura  . Complication of anesthesia     hard to wake up after tonsillectomy and nasoseptoplasty  . H/O alcohol abuse     19 years sober  . ADHD (attention deficit hyperactivity disorder)   . Allergy   . Migraine with aura   . Depression     Social History   Social History  . Marital Status: Married    Spouse Name: N/A  . Number of Children: N/A  . Years of Education: N/A   Occupational History  . Not on file.   Social History Main Topics  . Smoking status: Never Smoker   . Smokeless tobacco: Not on file  . Alcohol Use: No     Comment: sober for 19 years  . Drug Use: No  . Sexual Activity: Not on file   Other Topics Concern  . Not on file   Social History Narrative   ** Merged History Encounter **       ** Data from: 05/12/15 Enc Dept: [facility]       ** Data from: 06/30/15 Enc Dept: [facility]   Married. 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  Live in [place].  Enjoys running, mountain bike, kayak.     Past  Surgical History  Procedure Laterality Date  . Urethral stricture surgery      at age 86  . Tonsillectomy    . Colonoscopy    . Breast surgery Right     biopsy - benign  . Nasal septum surgery    . Mass excision Left 05/08/2015    Procedure: EXCISION OF LEFT DELTOID MASS;  Surgeon: [name], MD;  Location: [facility];  Service: General;  Laterality: Left;  . Lipoma excision Left     6 cm tumor    Family History  Problem Relation Age of Onset  . Obesity Mother   . High Cholesterol Mother   . High Cholesterol Father   . Hypothyroidism Father   . Arthritis Mother   . Hyperlipidemia Mother   . Hypertension Mother   . Prostate cancer Father   . Hyperlipidemia Father   . Hypertension Father   . Prostate cancer Maternal Uncle   . Colon cancer Maternal Uncle   . Breast cancer Paternal Aunt   . Diabetes Paternal Uncle   . Arthritis Maternal Grandmother   . Stroke Maternal Grandmother   .  Hypertension Maternal Grandmother   . Mental illness Maternal Grandmother   . Hypertension Maternal Grandfather   . Hyperlipidemia Paternal Grandmother   . Stroke Paternal Grandmother   . Prostate cancer Paternal Grandfather   . Hyperlipidemia Paternal Grandfather   . Heart disease Paternal Grandfather   . Hypertension Paternal Grandfather   . Prostate cancer Maternal Uncle   . Breast cancer Paternal Aunt     No Known Allergies  Current Outpatient Prescriptions on File Prior to Visit  Medication Sig Dispense Refill  . aspirin 81 MG tablet Take 81 mg by mouth. Take 1 PO 4-5 days/week    . Cholecalciferol (VITAMIN D3) 5000 UNITS TABS Take 5,000 Units by mouth once a week.    . escitalopram (LEXAPRO) 10 MG tablet Take 1 tablet (10 mg total) by mouth daily. 30 tablet 5  . vitamin B-12 (CYANOCOBALAMIN) 500 MCG tablet Take 500 mcg by mouth daily. 1 Tablet Po 4-5 days/week    . oxyCODONE-acetaminophen (ROXICET) 5-325 MG per tablet Take 1-2 tablets by mouth every 4 (four) hours as needed. (Patient  not taking: Reported on 10/12/2015) 30 tablet 0   No current facility-administered medications on file prior to visit.    BP 122/78 mmHg  Pulse 59  Temp(Src) 97.3 F (36.3 C) (Oral)  Ht 5\' 5"  (1.651 m)  Wt 126 lb 1.9 oz (57.208 kg)  BMI 20.99 kg/m2  SpO2 97%  LMP 09/21/2015    Objective:   Physical Exam  Constitutional: She appears well-nourished.  Cardiovascular: Normal rate and regular rhythm.   Pulmonary/Chest: Effort normal and breath sounds normal. Left breast exhibits mass and tenderness. Left breast exhibits no inverted nipple, no nipple discharge and no skin change.  Mass to left breast at 2-3 o'clock position. 1 cm in size, immobile, non tender.  Skin: Skin is warm and dry. No erythema.          Assessment & Plan:

## 2015-10-12 NOTE — Progress Notes (Signed)
Pre visit review using our clinic review tool, if applicable. No additional management support is needed unless otherwise documented below in the visit note. 

## 2015-10-12 NOTE — Patient Instructions (Signed)
Stop by the front and speak with [facility] regarding your diagnostic mammogram and ultrasound.  It was a pleasure to see you today!

## 2015-10-13 ENCOUNTER — Telehealth: Payer: Self-pay

## 2015-10-13 ENCOUNTER — Other Ambulatory Visit: Payer: Self-pay | Admitting: Primary Care

## 2015-10-13 ENCOUNTER — Ambulatory Visit
Admission: RE | Admit: 2015-10-13 | Discharge: 2015-10-13 | Disposition: A | Discharge status: Home / Self Care | Payer: 59 | Source: Ambulatory Visit | Attending: Primary Care | Admitting: Primary Care

## 2015-10-13 DIAGNOSIS — N63 Unspecified lump in unspecified breast: Secondary | ICD-10-CM

## 2015-10-13 NOTE — Telephone Encounter (Signed)
Chrissy with [facility] has put in order for US aspiration of breast and needs Allie Bossier NP to sign order so can be done now. Anda Kraft signed order and Chrissy confirmed received signed order; nothing further needed at this time.

## 2015-10-20 ENCOUNTER — Other Ambulatory Visit (HOSPITAL_COMMUNITY)
Admission: RE | Admit: 2015-10-20 | Discharge: 2015-10-20 | Disposition: A | Discharge status: Home / Self Care | Payer: 59 | Source: Ambulatory Visit | Attending: Primary Care | Admitting: Primary Care

## 2015-10-20 ENCOUNTER — Ambulatory Visit (INDEPENDENT_AMBULATORY_CARE_PROVIDER_SITE_OTHER): Payer: 59 | Admitting: Primary Care

## 2015-10-20 ENCOUNTER — Encounter: Payer: Self-pay | Admitting: Primary Care

## 2015-10-20 VITALS — BP 120/76 | HR 60 | Temp 98.3°F | Ht 65.0 in | Wt 125.8 lb

## 2015-10-20 DIAGNOSIS — F32A Depression, unspecified: Secondary | ICD-10-CM

## 2015-10-20 DIAGNOSIS — Z0000 Encounter for general adult medical examination without abnormal findings: Secondary | ICD-10-CM | POA: Diagnosis not present

## 2015-10-20 DIAGNOSIS — F329 Major depressive disorder, single episode, unspecified: Secondary | ICD-10-CM | POA: Diagnosis not present

## 2015-10-20 DIAGNOSIS — Z124 Encounter for screening for malignant neoplasm of cervix: Secondary | ICD-10-CM | POA: Diagnosis not present

## 2015-10-20 DIAGNOSIS — Z1151 Encounter for screening for human papillomavirus (HPV): Secondary | ICD-10-CM | POA: Insufficient documentation

## 2015-10-20 DIAGNOSIS — Z01419 Encounter for gynecological examination (general) (routine) without abnormal findings: Secondary | ICD-10-CM | POA: Diagnosis present

## 2015-10-20 NOTE — Progress Notes (Signed)
Pre visit review using our clinic review tool, if applicable. No additional management support is needed unless otherwise documented below in the visit note. 

## 2015-10-20 NOTE — Progress Notes (Signed)
Subjective:    Patient ID: Jessica Dodson, female    DOB: 1966-05-21, 49 y.o.   MRN: [id number]  HPI  Jessica Dodson is a 49 year old female who presents today for complete physical.  Immunizations: -Tetanus: Completed in 2007. -Influenza: Completed in October 2016   Diet: Endorses a healthy diet. Breakfast: Blueberry bagel, vegan pancakes and vegan sausage, oatmeal Lunch: Left overs Dinner: Stir fry, bean dishes, chick peas, brown rice Snacks: None during the day.  Desserts: Silk ice cream Beverages: Diet soda   Exercise: Running and rowing machine.  Eye exam: Completed in March 2016, no changes in vision.  Dental exam: Completes semi-annually. Colonoscopy: Completed in 2012. Due in 2022.  Pap Smear: Completed in 2011. No history of abnormal. Mammogram: Completed in December 2016   Review of Systems  Constitutional: Negative for unexpected weight change.  HENT: Negative for rhinorrhea.   Respiratory: Negative for cough and shortness of breath.   Cardiovascular: Negative for chest pain.  Gastrointestinal: Negative for diarrhea, constipation and blood in stool.  Genitourinary: Negative for difficulty urinating.       Regular periods.  Musculoskeletal:       Left hip pain, sees orthopedist.   Skin: Negative for rash.  Neurological: Negative for dizziness, numbness and headaches.  Psychiatric/Behavioral:       Has noticed increased anxiety at work, she will be seeking therapy through [facility]. Feels well managed on SSRI.       Past Medical History  Diagnosis Date  . Pneumonia 2016  . Depression     recurrent, not on meds at this time  . Headache     migraine (once a year) usually has an aura  . Complication of anesthesia     hard to wake up after tonsillectomy and nasoseptoplasty  . H/O alcohol abuse     19 years sober  . ADHD (attention deficit hyperactivity disorder)   . Allergy   . Migraine with aura   . Depression   . Cold sore     Social History   Social  History  . Marital Status: Married    Spouse Name: N/A  . Number of Children: N/A  . Years of Education: N/A   Occupational History  . Not on file.   Social History Main Topics  . Smoking status: Never Smoker   . Smokeless tobacco: Not on file  . Alcohol Use: No     Comment: sober for 19 years  . Drug Use: No  . Sexual Activity: Not on file   Other Topics Concern  . 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  Enjoys running, mountain bike, kayak.     Past Surgical History  Procedure Laterality Date  . Urethral stricture surgery      at age 75  . Tonsillectomy    . Colonoscopy    . Breast surgery Right     biopsy - benign  . Nasal septum surgery    . Mass excision Left 05/08/2015    Procedure: EXCISION OF LEFT DELTOID MASS;  Surgeon: [name], MD;  Location: [facility];  Service: General;  Laterality: Left;  . Lipoma excision Left  6 cm tumor    Family History  Problem Relation Age of Onset  . Obesity Mother   . High Cholesterol Mother   . High Cholesterol Father   . Hypothyroidism Father   . Arthritis Mother   . Hyperlipidemia Mother   . Hypertension Mother   . Prostate cancer Father   . Hyperlipidemia Father   . Hypertension Father   . Prostate cancer Maternal Uncle   . Colon cancer Maternal Uncle   . Breast cancer Paternal Aunt   . Diabetes Paternal Uncle   . Arthritis Maternal Grandmother   . Stroke Maternal Grandmother   . Hypertension Maternal Grandmother   . Mental illness Maternal Grandmother   . Hypertension Maternal Grandfather   . Hyperlipidemia Paternal Grandmother   . Stroke Paternal Grandmother   . Prostate cancer Paternal Grandfather   . Hyperlipidemia Paternal Grandfather   . Heart disease Paternal  Grandfather   . Hypertension Paternal Grandfather   . Prostate cancer Maternal Uncle   . Breast cancer Paternal Aunt     No Known Allergies  Current Outpatient Prescriptions on File Prior to Visit  Medication Sig Dispense Refill  . aspirin 81 MG tablet Take 81 mg by mouth. Take 1 PO 4-5 days/week    . Cholecalciferol (VITAMIN D3) 5000 UNITS TABS Take 5,000 Units by mouth once a week.    . escitalopram (LEXAPRO) 10 MG tablet Take 1 tablet (10 mg total) by mouth daily. 30 tablet 5  . vitamin B-12 (CYANOCOBALAMIN) 500 MCG tablet Take 500 mcg by mouth daily. 1 Tablet Po 4-5 days/week     No current facility-administered medications on file prior to visit.    BP 120/76 mmHg  Pulse 60  Temp(Src) 98.3 F (36.8 C) (Oral)  Ht 5\' 5"  (1.651 m)  Wt 125 lb 12.8 oz (57.063 kg)  BMI 20.93 kg/m2  SpO2 99%  LMP 09/28/2015    Objective:   Physical Exam  Constitutional: She is oriented to person, place, and time. She appears well-nourished.  HENT:  Right Ear: Tympanic membrane and ear canal normal.  Left Ear: Tympanic membrane and ear canal normal.  Nose: Nose normal.  Mouth/Throat: Oropharynx is clear and moist.  Eyes: Conjunctivae and EOM are normal. Pupils are equal, round, and reactive to light.  Neck: Neck supple. No thyromegaly present.  Cardiovascular: Normal rate and regular rhythm.   Pulmonary/Chest: Effort normal and breath sounds normal.  Abdominal: Soft. Bowel sounds are normal. There is no tenderness.  Musculoskeletal: Normal range of motion.  Lymphadenopathy:    She has no cervical adenopathy.  Neurological: She is alert and oriented to person, place, and time. She has normal reflexes. No cranial nerve deficit.  Skin: Skin is warm and dry.  Psychiatric: She has a normal mood and affect.          Assessment & Plan:

## 2015-10-20 NOTE — Assessment & Plan Note (Signed)
Tdap, influenza, mammogram UTD. Pap due and completed today. Colonoscopy due in 2022. Labs unremarkable. Exam unremarkable. Leads a healthy diet and exercises routinely. Discussed lower vitamin D levels and to supplement with OTC. Follow up in 1 year for repeat physical.

## 2015-10-20 NOTE — Assessment & Plan Note (Signed)
Stable on lexapro 10 mg. Increased stress at work recently and will be pursuing coulseling through the [facility] through work. Denies SI/HI. Will continue to monitor.

## 2015-10-20 NOTE — Patient Instructions (Signed)
Consider adding Vitamin D to your regimen. You should obtain 1000 units daily.  We will notify you of your pap results once received.  Continue your healthy lifestyle through healthy diet and exercise.  Follow up in 1 year or sooner if needed.  It was a pleasure to see you today!

## 2015-10-21 LAB — CYTOLOGY - PAP

## 2016-01-12 ENCOUNTER — Telehealth: Payer: Self-pay

## 2016-01-12 DIAGNOSIS — Z20828 Contact with and (suspected) exposure to other viral communicable diseases: Secondary | ICD-10-CM

## 2016-01-12 MED ORDER — OSELTAMIVIR PHOSPHATE 75 MG PO CAPS: 75.0000 mg | ORAL_CAPSULE | Freq: Every day | ORAL | Status: DC

## 2016-01-12 NOTE — Telephone Encounter (Signed)
RX for Tamiflu sent to [facility]. Take 1 capsule by mouth once daily for 10 days.

## 2016-01-12 NOTE — Telephone Encounter (Signed)
Pt left v/m; pt has been exposed to the flu; pts father is going into hospital on 01/15/16 for surgery on carotid; and pt request 10 day preventive rx for tamiflu to [facility] employee pharmacy; pt is to help take care of her father and does not want to get sick with flu.

## 2016-01-12 NOTE — Telephone Encounter (Signed)
Called and notified patient of Kate's comments. Patient verbalized understanding.  

## 2016-01-25 ENCOUNTER — Encounter: Payer: Self-pay | Admitting: Internal Medicine

## 2016-01-25 ENCOUNTER — Ambulatory Visit (INDEPENDENT_AMBULATORY_CARE_PROVIDER_SITE_OTHER): Payer: 59 | Admitting: Internal Medicine

## 2016-01-25 VITALS — BP 108/70 | HR 92 | Temp 99.2°F | Wt 127.0 lb

## 2016-01-25 DIAGNOSIS — J069 Acute upper respiratory infection, unspecified: Secondary | ICD-10-CM

## 2016-01-25 DIAGNOSIS — B9789 Other viral agents as the cause of diseases classified elsewhere: Principal | ICD-10-CM

## 2016-01-25 NOTE — Progress Notes (Signed)
HPI  Pt presents to the clinic today with c/o runny nose, sneezing and cough. This started yesterday. She is blowing clear mucous out of her nose. The cough is nonproductive. She has had fatigue, low grade fever and body aches. She has not taken anything OTC. She has a history of seasonal allergies. She has had sick contacts. She did get her flu shot this year.  Review of Systems      Past Medical History  Diagnosis Date  . Pneumonia 2016  . Depression     recurrent, not on meds at this time  . Headache     migraine (once a year) usually has an aura  . Complication of anesthesia     hard to wake up after tonsillectomy and nasoseptoplasty  . H/O alcohol abuse     19 years sober  . ADHD (attention deficit hyperactivity disorder)   . Allergy   . Migraine with aura   . Depression   . Cold sore     Family History  Problem Relation Age of Onset  . Obesity Mother   . High Cholesterol Mother   . High Cholesterol Father   . Hypothyroidism Father   . Arthritis Mother   . Hyperlipidemia Mother   . Hypertension Mother   . Prostate cancer Father   . Hyperlipidemia Father   . Hypertension Father   . Prostate cancer Maternal Uncle   . Colon cancer Maternal Uncle   . Breast cancer Paternal Aunt   . Diabetes Paternal Uncle   . Arthritis Maternal Grandmother   . Stroke Maternal Grandmother   . Hypertension Maternal Grandmother   . Mental illness Maternal Grandmother   . Hypertension Maternal Grandfather   . Hyperlipidemia Paternal Grandmother   . Stroke Paternal Grandmother   . Prostate cancer Paternal Grandfather   . Hyperlipidemia Paternal Grandfather   . Heart disease Paternal Grandfather   . Hypertension Paternal Grandfather   . Prostate cancer Maternal Uncle   . Breast cancer Paternal Aunt     Social History   Social History  . Marital Status: Married    Spouse Name: N/A  . Number of Children: N/A  . Years of Education: N/A   Occupational History  . Not on file.    Social History Main Topics  . Smoking status: Never Smoker   . Smokeless tobacco: Not on file  . Alcohol Use: No     Comment: sober for 19 years  . Drug Use: No  . Sexual Activity: Not on file   Other Topics Concern  . 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  Enjoys running, mountain bike, kayak.     No Known Allergies   Constitutional: Positive fatigue and fever. Denies headache, abrupt weight changes.  HEENT:  Positive runny nose. Denies eye redness, eye pain, pressure behind the eyes, facial pain, nasal congestion, ear pain, ringing in the ears, wax buildup or sore throat. Respiratory: Positive cough. Denies difficulty breathing or shortness of breath.  Cardiovascular: Denies chest pain, chest tightness, palpitations or swelling in the hands or feet.   No other specific complaints in a complete review of systems (except as listed in HPI above).  Objective:  BP 108/70 mmHg  Pulse 92  Temp(Src) 99.2 F (37.3 C) (Oral)  Wt 127 lb (57.607 kg)  SpO2 99%  LMP 01/09/2016  Wt Readings from Last 3 Encounters:  01/25/16 127 lb (57.607 kg)  10/20/15 125 lb 12.8 oz (57.063 kg)  10/12/15 126 lb 1.9 oz (57.208 kg)     General: Appears her stated age, ill appearing in NAD. HEENT: Head: normal shape and size, no sinus tenderness noted; Eyes: sclera white, no icterus, conjunctiva pink; Ears: Tm's pink but intact, normal light reflex;   Throat/Mouth: + PND. Teeth present, mucosa pink and moist, no exudate noted, no lesions or ulcerations noted.  Neck: No cervical lymphadenopathy.  Cardiovascular: Normal rate and rhythm. S1,S2 noted.  No murmur, rubs or gallops noted.  Pulmonary/Chest: Normal effort and positive vesicular breath sounds. No  respiratory distress. No wheezes, rales or ronchi noted.      Assessment & Plan:   Viral Upper Respiratory Infection with Cough:  Rapid Flu: negative Get some rest and drink plenty of water Start Flonase OTC Delsym as needed for cough  RTC as needed or if symptoms persist.

## 2016-01-25 NOTE — Progress Notes (Signed)
Pre visit review using our clinic review tool, if applicable. No additional management support is needed unless otherwise documented below in the visit note. 

## 2016-01-25 NOTE — Patient Instructions (Addendum)

## 2016-01-29 ENCOUNTER — Other Ambulatory Visit: Payer: Self-pay | Admitting: Primary Care

## 2016-01-29 ENCOUNTER — Encounter: Payer: Self-pay | Admitting: Primary Care

## 2016-01-29 DIAGNOSIS — F329 Major depressive disorder, single episode, unspecified: Secondary | ICD-10-CM

## 2016-01-29 DIAGNOSIS — F32A Depression, unspecified: Secondary | ICD-10-CM

## 2016-01-29 MED ORDER — ESCITALOPRAM OXALATE 10 MG PO TABS: 15.0000 mg | ORAL_TABLET | Freq: Every day | ORAL | Status: DC

## 2016-02-02 ENCOUNTER — Other Ambulatory Visit: Payer: Self-pay | Admitting: Primary Care

## 2016-02-02 DIAGNOSIS — F32A Depression, unspecified: Secondary | ICD-10-CM

## 2016-02-02 DIAGNOSIS — F329 Major depressive disorder, single episode, unspecified: Secondary | ICD-10-CM

## 2016-02-02 MED ORDER — ESCITALOPRAM OXALATE 20 MG PO TABS: 20.0000 mg | ORAL_TABLET | Freq: Every day | ORAL | Status: DC

## 2016-02-24 ENCOUNTER — Ambulatory Visit (INDEPENDENT_AMBULATORY_CARE_PROVIDER_SITE_OTHER): Payer: 59 | Admitting: Primary Care

## 2016-02-24 ENCOUNTER — Encounter: Payer: Self-pay | Admitting: Primary Care

## 2016-02-24 VITALS — BP 116/70 | HR 52 | Temp 97.7°F | Ht 65.0 in | Wt 129.8 lb

## 2016-02-24 DIAGNOSIS — F329 Major depressive disorder, single episode, unspecified: Secondary | ICD-10-CM

## 2016-02-24 DIAGNOSIS — F32A Depression, unspecified: Secondary | ICD-10-CM

## 2016-02-24 DIAGNOSIS — B001 Herpesviral vesicular dermatitis: Secondary | ICD-10-CM | POA: Insufficient documentation

## 2016-02-24 HISTORY — DX: Herpesviral vesicular dermatitis: B00.1

## 2016-02-24 MED ORDER — VALACYCLOVIR HCL 1 G PO TABS: ORAL_TABLET | ORAL | Status: DC

## 2016-02-24 NOTE — Patient Instructions (Addendum)
Continue Lexapro 20 mg tablets as discussed.  You may take Valtrex tablets as needed for cold sore breakouts. Take 2 tablets twice daily for 1 day.  It was a pleasure to see you today!

## 2016-02-24 NOTE — Progress Notes (Signed)
Pre visit review using our clinic review tool, if applicable. No additional management support is needed unless otherwise documented below in the visit note. 

## 2016-02-24 NOTE — Assessment & Plan Note (Signed)
Long history of, current breakout today. RX for Valtrex PRN treatment sent to pharmacy.

## 2016-02-24 NOTE — Progress Notes (Signed)
Subjective:    Patient ID: Jessica Dodson, female    DOB: 1966/08/16, 50 y.o.   MRN: [id number]  HPI  Jessica Dodson is a 50 year old female who presents today for follow up of Major Depressive Disorder. She has a long history of depression and was managed on Lexapro 10 mg tablets for years. She has recently been under increased stress with her ill, elderly parents and required an increase in her dose to 20 mg about 1 month ago.  Since her dose increase she's feeling much improved. She's stopped picking at her nails, she's noticed a decrease in worrying, and is able to mange her stress. Denies SI/HI, GI upset, headaches.   2) Herpes Labialis: Long history of since childhood. She will experience 5-10 breakouts per year, mostly caused by recurrent sun exposure. She's never been managed on prescription treatment in the past and would like to try. She has a current breakout to her top lip.  Review of Systems  Gastrointestinal: Negative for nausea and abdominal pain.  Skin:       Herpes Labialis  Neurological: Negative for headaches.  Psychiatric/Behavioral: Negative for suicidal ideas. The patient is not nervous/anxious.        Past Medical History  Diagnosis Date  . Pneumonia 2016  . Depression     recurrent, not on meds at this time  . Headache     migraine (once a year) usually has an aura  . Complication of anesthesia     hard to wake up after tonsillectomy and nasoseptoplasty  . H/O alcohol abuse     19 years sober  . ADHD (attention deficit hyperactivity disorder)   . Allergy   . Migraine with aura   . Depression   . Cold sore      Social History   Social History  . Marital Status: Married    Spouse Name: N/A  . Number of Children: N/A  . Years of Education: N/A   Occupational History  . Not on file.   Social History Main Topics  . Smoking status: Never Smoker   . Smokeless tobacco: Not on file  . Alcohol Use: No     Comment: sober for 19 years  . Drug Use: No  .  Sexual Activity: Not on file   Other Topics Concern  . Not on file   Social History Narrative   ** Merged History Encounter **       ** Data from: 05/12/15 Enc Dept: [facility]       ** Data from: 06/30/15 Enc Dept: [facility]   Married. 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  Live in [place].  Enjoys running, mountain bike, kayak.     Past Surgical History  Procedure Laterality Date  . Urethral stricture surgery      at age 63  . Tonsillectomy    . Colonoscopy    . Breast surgery Right     biopsy - benign  . Nasal septum surgery    . Mass excision Left 05/08/2015    Procedure: EXCISION OF LEFT DELTOID MASS;  Surgeon: [name], MD;  Location: [facility];  Service: General;  Laterality: Left;  . Lipoma excision Left     6 cm tumor    Family History  Problem Relation Age of Onset  . Obesity Mother   . High Cholesterol Mother   . High Cholesterol Father   . Hypothyroidism Father   . Arthritis  Mother   . Hyperlipidemia Mother   . Hypertension Mother   . Prostate cancer Father   . Hyperlipidemia Father   . Hypertension Father   . Prostate cancer Maternal Uncle   . Colon cancer Maternal Uncle   . Breast cancer Paternal Aunt   . Diabetes Paternal Uncle   . Arthritis Maternal Grandmother   . Stroke Maternal Grandmother   . Hypertension Maternal Grandmother   . Mental illness Maternal Grandmother   . Hypertension Maternal Grandfather   . Hyperlipidemia Paternal Grandmother   . Stroke Paternal Grandmother   . Prostate cancer Paternal Grandfather   . Hyperlipidemia Paternal Grandfather   . Heart disease Paternal Grandfather   . Hypertension Paternal Grandfather   . Prostate cancer Maternal Uncle   . Breast cancer Paternal Aunt     No Known Allergies  Current Outpatient Prescriptions on File Prior to Visit  Medication Sig Dispense Refill  . aspirin 81 MG tablet Take 81 mg by mouth. Take 1 PO 4-5 days/week    . Cholecalciferol  (VITAMIN D3) 5000 UNITS TABS Take 5,000 Units by mouth once a week.    . escitalopram (LEXAPRO) 20 MG tablet Take 1 tablet (20 mg total) by mouth daily. 30 tablet 3  . vitamin B-12 (CYANOCOBALAMIN) 500 MCG tablet Take 500 mcg by mouth daily. 1 Tablet Po 4-5 days/week     No current facility-administered medications on file prior to visit.    BP 116/70 mmHg  Pulse 52  Temp(Src) 97.7 F (36.5 C) (Oral)  Ht 5\' 5"  (1.651 m)  Wt 129 lb 12.8 oz (58.877 kg)  BMI 21.60 kg/m2  SpO2 98%  LMP 02/07/2016    Objective:   Physical Exam  Constitutional: She appears well-nourished.  Cardiovascular: Normal rate and regular rhythm.   Pulmonary/Chest: Effort normal and breath sounds normal.  Skin: Skin is warm and dry.  Small herpetic fever blister to right top lip.  Psychiatric: She has a normal mood and affect.          Assessment & Plan:

## 2016-02-24 NOTE — Assessment & Plan Note (Signed)
Increased dose of Lexapro to 20 mg 1 month ago due to increased stress as she is helping to manage her ill, elderly parents. Feeling much improved on new dose. No side effects. Will continue same.

## 2016-03-22 ENCOUNTER — Encounter: Payer: Self-pay | Admitting: Primary Care

## 2016-03-23 ENCOUNTER — Other Ambulatory Visit: Payer: Self-pay | Admitting: Primary Care

## 2016-03-23 DIAGNOSIS — F329 Major depressive disorder, single episode, unspecified: Secondary | ICD-10-CM

## 2016-03-23 DIAGNOSIS — F32A Depression, unspecified: Secondary | ICD-10-CM

## 2016-03-28 ENCOUNTER — Telehealth: Payer: Self-pay | Admitting: Primary Care

## 2016-03-28 NOTE — Telephone Encounter (Signed)
Psychiatry referral places on [address]. Pt is aware their office will call her directly to schedule

## 2016-06-03 ENCOUNTER — Encounter (HOSPITAL_COMMUNITY): Payer: Self-pay

## 2016-06-14 ENCOUNTER — Ambulatory Visit (HOSPITAL_COMMUNITY): Payer: Self-pay | Admitting: Psychiatry

## 2016-07-20 ENCOUNTER — Encounter: Payer: Self-pay | Admitting: Primary Care

## 2016-07-20 ENCOUNTER — Ambulatory Visit (INDEPENDENT_AMBULATORY_CARE_PROVIDER_SITE_OTHER): Payer: 59 | Admitting: Primary Care

## 2016-07-20 VITALS — BP 142/82 | HR 59 | Temp 98.8°F | Ht 65.0 in | Wt 131.8 lb

## 2016-07-20 DIAGNOSIS — F32A Depression, unspecified: Secondary | ICD-10-CM

## 2016-07-20 DIAGNOSIS — G250 Essential tremor: Secondary | ICD-10-CM | POA: Diagnosis not present

## 2016-07-20 DIAGNOSIS — F329 Major depressive disorder, single episode, unspecified: Secondary | ICD-10-CM

## 2016-07-20 DIAGNOSIS — D72819 Decreased white blood cell count, unspecified: Secondary | ICD-10-CM | POA: Diagnosis not present

## 2016-07-20 DIAGNOSIS — R635 Abnormal weight gain: Secondary | ICD-10-CM

## 2016-07-20 LAB — CBC
HEMATOCRIT: 36.3 % (ref 36.0–46.0)
HEMOGLOBIN: 12.3 g/dL (ref 12.0–15.0)
MCHC: 33.9 g/dL (ref 30.0–36.0)
MCV: 93.3 fl (ref 78.0–100.0)
PLATELETS: 218 10*3/uL (ref 150.0–400.0)
RBC: 3.89 Mil/uL (ref 3.87–5.11)
RDW: 13.4 % (ref 11.5–15.5)
WBC: 4.9 10*3/uL (ref 4.0–10.5)

## 2016-07-20 LAB — BASIC METABOLIC PANEL
BUN: 11 mg/dL (ref 6–23)
CALCIUM: 9 mg/dL (ref 8.4–10.5)
CHLORIDE: 103 meq/L (ref 96–112)
CO2: 32 mEq/L (ref 19–32)
Creatinine, Ser: 0.62 mg/dL (ref 0.40–1.20)
GFR: 108.66 mL/min (ref >60.00)
Glucose, Bld: 85 mg/dL (ref 70–99)
POTASSIUM: 3.6 meq/L (ref 3.5–5.1)
SODIUM: 140 meq/L (ref 135–145)

## 2016-07-20 LAB — TSH: TSH: 0.73 u[IU]/mL (ref 0.35–4.50)

## 2016-07-20 NOTE — Progress Notes (Signed)
Subjective:    Patient ID: Jessica Dodson, female    DOB: 19-Oct-1966, 50 y.o.   MRN: [id number]  HPI  Jessica Dodson is a 50 year old female with a history of Major Depressive Disorder who presents today for follow up. She is currently managed on Lexapro 10 mg which is a decrease from the 20 mg that was prescribed several months ago. Several months ago she was caring for both of her elderly, ill parents and was experiencing increased anxiety and stress. She is currently going through anti-anxiety classes through her employer and has overall noticed improvement. She reduced her dose down to 10 mg 1 month ago and is doing well at this dose.  2) Essential Tremor: Long history of since medical school. Intermittently progressive throughout her life. She has noticed progression moreso over the psat 6 months while at work . She was recently assisting with a suturing case and had to stop as she was shaking. She has since stopped performing knee injectoins and mole removals due to this tremor. She does not wish to go on beta blocker therapy. Her baseline HR is in the upper 50's.   3) Weight gain: Gradual weight gain of 6 pounds over the past 6 months. She is unsure of the reasoning as she is currently training for a marathon and runs nearly every day. She eats a healthy, vegan diet and denies changes in her diet. She's also noticed constipation and is experiencing bowel movements twice weekly on average. Denies changes in diet, fatigue, hair loss.   Wt Readings from Last 3 Encounters:  07/20/16 131 lb 12.8 oz (59.8 kg)  02/24/16 129 lb 12.8 oz (58.9 kg)  01/25/16 127 lb (57.6 kg)     Review of Systems  Constitutional: Positive for unexpected weight change. Negative for fatigue.  Endocrine: Negative for cold intolerance.  Neurological: Positive for tremors.  Psychiatric/Behavioral: Negative for sleep disturbance and suicidal ideas. The patient is not nervous/anxious.        Past Medical History:    Diagnosis Date  . ADHD (attention deficit hyperactivity disorder)   . Allergy   . Cold sore   . Complication of anesthesia    hard to wake up after tonsillectomy and nasoseptoplasty  . Depression    recurrent, not on meds at this time  . Depression   . H/O alcohol abuse    19 years sober  . Headache    migraine (once a year) usually has an aura  . Migraine with aura   . Pneumonia 2016     Social History   Social History  . Marital status: Married    Spouse name: N/A  . Number of children: N/A  . Years of education: N/A   Occupational History  . Not on file.   Social History Main Topics  . Smoking status: Never Smoker  . Smokeless tobacco: Not on file  . Alcohol use No     Comment: sober for 19 years  . Drug use: No  . Sexual activity: Not on file   Other Topics Concern  . 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  Enjoys running, mountain bike, kayak.  Past Surgical History:  Procedure Laterality Date  . BREAST SURGERY Right    biopsy - benign  . COLONOSCOPY    . LIPOMA EXCISION Left    6 cm tumor  . MASS EXCISION Left 05/08/2015   Procedure: EXCISION OF LEFT DELTOID MASS;  Surgeon: [name], MD;  Location: [facility];  Service: General;  Laterality: Left;  . NASAL SEPTUM SURGERY    . TONSILLECTOMY    . urethral stricture surgery     at age 52    Family History  Problem Relation Age of Onset  . Obesity Mother   . High Cholesterol Mother   . High Cholesterol Father   . Hypothyroidism Father   . Arthritis Mother   . Hyperlipidemia Mother   . Hypertension Mother   . Prostate cancer Father   . Hyperlipidemia Father   . Hypertension Father   . Prostate cancer Maternal Uncle   . Colon cancer Maternal Uncle   .  Breast cancer Paternal Aunt   . Diabetes Paternal Uncle   . Arthritis Maternal Grandmother   . Stroke Maternal Grandmother   . Hypertension Maternal Grandmother   . Mental illness Maternal Grandmother   . Hypertension Maternal Grandfather   . Hyperlipidemia Paternal Grandmother   . Stroke Paternal Grandmother   . Prostate cancer Paternal Grandfather   . Hyperlipidemia Paternal Grandfather   . Heart disease Paternal Grandfather   . Hypertension Paternal Grandfather   . Prostate cancer Maternal Uncle   . Breast cancer Paternal Aunt     No Known Allergies  Current Outpatient Prescriptions on File Prior to Visit  Medication Sig Dispense Refill  . aspirin 81 MG tablet Take 81 mg by mouth. Take 1 PO 4-5 days/week    . Cholecalciferol (VITAMIN D3) 5000 UNITS TABS Take 5,000 Units by mouth once a week.    . valACYclovir (VALTREX) 1000 MG tablet Take 2 tablets by mouth twice daily for 1 day as needed for breakouts. 30 tablet 3  . vitamin B-12 (CYANOCOBALAMIN) 500 MCG tablet Take 500 mcg by mouth daily. 1 Tablet Po 4-5 days/week     No current facility-administered medications on file prior to visit.     BP (!) 142/82   Pulse (!) 59   Temp 98.8 F (37.1 C) (Oral)   Ht 5\' 5"  (1.651 m)   Wt 131 lb 12.8 oz (59.8 kg)   LMP 07/08/2016   SpO2 97%   BMI 21.93 kg/m    Objective:   Physical Exam  Constitutional: She is oriented to person, place, and time. She appears well-nourished.  Eyes: EOM are normal. Pupils are equal, round, and reactive to light.  Neck: Neck supple. No thyromegaly present.  Cardiovascular: Normal rate and regular rhythm.   Pulmonary/Chest: Effort normal and breath sounds normal.  Neurological: She is alert and oriented to person, place, and time. She has normal reflexes. No cranial nerve deficit.  Essential tremor noted to bilateral hands upon rest and movement.  Skin: Skin is warm and dry.          Assessment & Plan:  Weight Gain:  Gain of 6 pounds in  6 months. Patient uneasy given her activity level and healthy diet. No alarm signs.  TSH unremarkable.  Exam unremarkable. Could be increased muscle mass. Will continue to monitor.  Jessica Flow, NP

## 2016-07-20 NOTE — Patient Instructions (Signed)
Complete lab work prior to leaving today. I will notify you of your results once received.   You will be contacted regarding your referral to Neurology.  Please let us know if you have not heard back within one week.   You look great, please e-mail me if you need anything!  It was a pleasure to see you today!

## 2016-07-20 NOTE — Progress Notes (Signed)
Pre visit review using our clinic review tool, if applicable. No additional management support is needed unless otherwise documented below in the visit note. 

## 2016-07-21 DIAGNOSIS — G250 Essential tremor: Secondary | ICD-10-CM | POA: Insufficient documentation

## 2016-07-21 HISTORY — DX: Essential tremor: G25.0

## 2016-07-21 NOTE — Assessment & Plan Note (Signed)
Chronic since early adulthood. Progressed over past 6 months.  Tremor evident upon rest and also with movement. Referral placed to neurology per patient's request for further evaluation. Not a good candidate for beta blocker treatment.

## 2016-07-21 NOTE — Assessment & Plan Note (Signed)
Reduced Lexapro down to 10 mg on her own 1 month ago. Feeling much better as she has come to terms with her parent's illness/trajectory. Also noticed improvement with anti-anxiety classes through occupation. Continue Lexapro 10 mg.  Will continue to monitor.

## 2016-08-24 ENCOUNTER — Other Ambulatory Visit: Payer: Self-pay | Admitting: Primary Care

## 2016-08-24 ENCOUNTER — Ambulatory Visit (INDEPENDENT_AMBULATORY_CARE_PROVIDER_SITE_OTHER): Payer: 59 | Admitting: Neurology

## 2016-08-24 ENCOUNTER — Encounter: Payer: Self-pay | Admitting: Neurology

## 2016-08-24 VITALS — BP 122/70 | HR 60 | Resp 16 | Ht 65.0 in | Wt 132.0 lb

## 2016-08-24 DIAGNOSIS — G250 Essential tremor: Secondary | ICD-10-CM

## 2016-08-24 DIAGNOSIS — F329 Major depressive disorder, single episode, unspecified: Secondary | ICD-10-CM

## 2016-08-24 DIAGNOSIS — F32A Depression, unspecified: Secondary | ICD-10-CM

## 2016-08-24 MED ORDER — PRIMIDONE 50 MG PO TABS: 50.0000 mg | ORAL_TABLET | Freq: Every day | ORAL | 5 refills | Status: DC

## 2016-08-24 NOTE — Patient Instructions (Signed)
Let's try you on Mysoline (primidone) 50 mg strength: Take 1/2 pill each bedtime for 2 weeks, then 1 pill each bedtime thereafter. We have room to increase after that. Common side effects reported are: Sleepiness, drowsiness, balance problems, confusion, and GI related symptoms.   Please remember, common headache triggers are: sleep deprivation, dehydration, overheating, stress, hypoglycemia or skipping meals and blood sugar fluctuations, excessive pain medications or excessive alcohol use or caffeine withdrawal. Some people have food triggers such as aged cheese, orange juice or chocolate, especially dark chocolate, or MSG (monosodium glutamate). Try to avoid these headache triggers as much possible. It may be helpful to keep a headache diary to figure out what makes your headaches worse or brings them on and what alleviates them. Some people report headache onset after exercise but studies have shown that regular exercise may actually prevent headaches from coming. If you have exercise-induced headaches, please make sure that you drink plenty of fluid before and after exercising and that you do not over do it and do not overheat.  Take OTC Mg, riboflavin and CoQ10 for headache prevention.

## 2016-08-24 NOTE — Progress Notes (Signed)
Subjective:    Patient ID: Jessica Dodson is a 50 y.o. female.  HPI     Star Age, MD, PhD [facility] [address], [address] [place], [place] [address]  Dear Jessica Dodson,   I saw your patient, Jessica Dodson, upon your kind request, in my neurologic clinic today for initial consultation of her essential tremor. The patient is unaccompanied today. 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  4 years her tremors were minimal and not noticeable. In the past couple of years she has noted progression. In particular, she has difficulty doing some fine motor tasks including removing sutures or moles. Tremor is a little bit worse on the right than left, handwriting is not significantly affected.  I reviewed your office note from 07/20/2016. She is currently on Lexapro, which she recently reduced to 10 mg once daily. She's working through her mood disorder including her anxiety with counseling/anxiety classes.  Her parents are elderly and ailing and she has been caring for them.   She lives with her husband, has no children, is a runner, no alcohol (since she had the issue with dependence for about 1 year, while in medschool, got treatment). Drinks caffeine, about 3 cups per day. No sodas, eats vegan. Has started meditation and relaxation, which seems to help.  Has not noted Any significant worsening of her tremors after exercise.  She also has a history of migraines, these are thankfully infrequent. She has visual auras. She has never tried triptans and would like to avoid triptans. She has no family history of a central  tremor, she has a family history of alcohol abuse. Migraine frequency is currently about once per month. She misses about 5 days of work a year secondary to migraines but would like to start taking supplements for prevention rather than prescription medication. She tries to hydrate well.   Her Past Medical History Is Significant For: Past Medical History:  Diagnosis Date  . ADHD (attention deficit hyperactivity disorder)   . Allergy   . Cold sore   . Complication of anesthesia    hard to wake up after tonsillectomy and nasoseptoplasty  . Depression    recurrent, not on meds at this time  . Depression   . H/O alcohol abuse    19 years sober  . Headache    migraine (once a year) usually has an aura  . Migraine with aura   . Pneumonia 2016    Her Past Surgical History Is Significant For: Past Surgical History:  Procedure Laterality Date  . BREAST SURGERY Right    biopsy - benign  . COLONOSCOPY    . LIPOMA EXCISION Left    6 cm tumor  . MASS EXCISION Left 05/08/2015   Procedure: EXCISION OF LEFT DELTOID MASS;  Surgeon: [name], MD;  Location: [place];  Service: General;  Laterality: Left;  . NASAL SEPTUM SURGERY    . TONSILLECTOMY    . urethral stricture surgery     at age 59    Her Family History Is Significant For: Family History  Problem Relation Age of Onset  . Obesity Mother   . High Cholesterol Mother   .  Arthritis Mother   . Hyperlipidemia Mother   . Hypertension Mother   . High Cholesterol Father   . Hypothyroidism Father   . Prostate cancer Father   . Hyperlipidemia Father   . Hypertension Father   . Prostate cancer Maternal Uncle   . Colon cancer Maternal Uncle   . Breast cancer Paternal Aunt   . Arthritis Maternal Grandmother   . Stroke Maternal Grandmother   . Hypertension Maternal Grandmother   . Mental illness Maternal Grandmother   . Hypertension Maternal Grandfather   . Hyperlipidemia Paternal Grandmother   . Stroke Paternal Grandmother   .  Prostate cancer Paternal Grandfather   . Hyperlipidemia Paternal Grandfather   . Heart disease Paternal Grandfather   . Hypertension Paternal Grandfather   . Prostate cancer Maternal Uncle   . Breast cancer Paternal Aunt   . Diabetes Paternal Uncle     Her Social History Is Significant For: Social History   Social History  . Marital status: Married    Spouse name: N/A  . Number of children: 0  . Years of education: MD   Social History Main Topics  . Smoking status: Never Smoker  . Smokeless tobacco: Never Used  . Alcohol use 0.0 oz/week     Comment: sober for 19 years  . Drug use: No  . Sexual activity: Not Asked   Other Topics Concern  . None   Social History Narrative   ** Merged History Encounter **       ** Data from: 05/12/15 Enc Dept: [place]       ** Data from: 06/30/15 Enc Dept: [facility]   Married. 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  Live in [place].  Enjoys running, mountain bike, kayak.     Her Allergies Are:  No Known Allergies:   Her Current Medications Are:  Outpatient Encounter Prescriptions as of 08/24/2016  Medication Sig  . aspirin 81 MG tablet Take 81 mg by mouth. Take 1 PO 4-5 days/week  . Cholecalciferol (VITAMIN D3) 5000 UNITS TABS Take 5,000 Units by mouth once a week.  . escitalopram (LEXAPRO) 10 MG tablet Take 10 mg by mouth daily.  . valACYclovir (VALTREX) 1000 MG tablet Take 2 tablets by mouth twice daily for 1 day as needed for breakouts.  . vitamin B-12 (CYANOCOBALAMIN) 500 MCG tablet Take 500 mcg by mouth daily. 1 Tablet Po 4-5 days/week   No facility-administered encounter medications on file as of 08/24/2016.   : Review of Systems:  Out of a complete 14 point review of systems, all are reviewed and negative with the exception of these symptoms as listed below:  Review of Systems  Neurological:       Patient reports that she has had a tremor in both hands since her late 20's. States  that they have recently become worse.     Objective:  Neurologic Exam  Physical Exam Physical Examination:   Vitals:   08/24/16 1430  BP: 122/70  Pulse: 60  Resp: 16    General Examination: The patient is a very pleasant 50 y.o. female in no acute distress. She appears well-developed and well-nourished and well groomed.   HEENT: Normocephalic, atraumatic, pupils are equal, round and reactive to light and accommodation. Funduscopic exam is normal with sharp disc margins noted. Extraocular tracking is good without limitation to gaze excursion or nystagmus noted. Normal smooth pursuit is noted. Hearing is grossly intact. Tympanic membranes are clear bilaterally. Face is  symmetric with normal facial animation and normal facial sensation. Speech is clear with no dysarthria noted. There is no hypophonia. There is no lip, neck/head, jaw or voice tremor. Neck is supple with full range of passive and active motion. There are no carotid bruits on auscultation. Oropharynx exam reveals: mild mouth dryness, good dental hygiene and mild airway crowding. Mallampati is class II. Tongue protrudes centrally and palate elevates symmetrically.   Chest: Clear to auscultation without wheezing, rhonchi or crackles noted.  Heart: S1+S2+0, regular and normal without murmurs, rubs or gallops noted.   Abdomen: Soft, non-tender and non-distended with normal bowel sounds appreciated on auscultation.  Extremities: There is no pitting edema in the distal lower extremities bilaterally. Pedal pulses are intact.  Skin: Warm and dry without trophic changes noted. There are no varicose veins.  Musculoskeletal: exam reveals no obvious joint deformities, tenderness or joint swelling or erythema.   Neurologically:  Mental status: The patient is awake, alert and oriented in all 4 spheres. Her immediate and remote memory, attention, language skills and fund of knowledge are appropriate. There is no evidence of aphasia,  agnosia, apraxia or anomia. Speech is clear with normal prosody and enunciation. Thought process is linear. Mood is normal and affect is normal.  Cranial nerves II - XII are as described above under HEENT exam. In addition: shoulder shrug is normal with equal shoulder height noted. Motor exam: Normal bulk, strength and tone is noted. There is no drift, or rebound. She has a mild resting tremor in the right hand only, this is very fast and small in amplitude. In addition, she has a bilateral upper extremity postural and action tremor, right side mildly worse than left, no intention tremor.  On 08/24/2016: Archimedes spiral drawing shows very mild tremulousness bilaterally, handwriting is legible, not tremulous, no micrographia is noted. Romberg is negative. Reflexes are 2+ throughout. Babinski: Toes are flexor bilaterally. Fine motor skills and coordination: intact with normal finger taps, normal hand movements, normal rapid alternating patting, normal foot taps and normal foot agility.  Cerebellar testing: No dysmetria or intention tremor on finger to nose testing. Heel to shin is unremarkable bilaterally. There is no truncal or gait ataxia.  Sensory exam: intact to light touch, pinprick, vibration, temperature sense in the upper and lower extremities.  Gait, station and balance: She stands easily. No veering to one side is noted. No leaning to one side is noted. Posture is age-appropriate and stance is narrow based. Gait shows normal stride length and normal pace. No problems turning are noted. Tandem walk is unremarkable.  Assessment and Plan:   In summary, LATONDA FAMBROUGH is a very pleasant 50 y.o.-year old female with an underlying medical history of allergies, depression, anxiety, ADHD, remote history of alcohol abuse, migraine headaches, and history of pneumonia in the past, whose history and physical exam are in keeping with essential tremor, with slight predominance on the right side. The only thing  that is not typical of in her case is the absence of family history. In addition, her tremor occurred for the first time as she recalls after she underwent treatment for her alcohol abuse. Nevertheless, she has no other atypical findings on exam, no parkinsonism and was reassured in that regard. She has no other focality on exam and a brain scan is not warranted at this time. She has infrequent migraines with aura. She is advised to start over-the-counter magnesium, riboflavin and coenzyme Q10. We talked about potential headache triggers. She is reminded to  stay well-hydrated. As far as tremor treatment, I suggested a trial of low-dose Mysoline. A beta blocker may not be a good choice for her given her history of mood disorder as well as low normal baseline heart rate. We will start with Mysoline 50 mg strength half a pill at night for about 2 weeks and then she can try to increase this to a whole pill each night thereafter. We do have room to increase after that. We talked about potential side effects including sedation, balance problems, GI related problems. I provided written instructions as well as a new prescription. Otherwise, her neurological exam is completely nonfocal. I would like to see her back in 6 months, sooner as needed. I answered all her questions today and she was in agreement.   Thank you very much for allowing me to participate in the care of this nice patient. If I can be of any further assistance to you please do not hesitate to call me at [phone].  Sincerely,   Star Age, MD, PhD

## 2016-08-25 DIAGNOSIS — S838X1A Sprain of other specified parts of right knee, initial encounter: Secondary | ICD-10-CM | POA: Diagnosis not present

## 2016-10-05 ENCOUNTER — Other Ambulatory Visit: Payer: Self-pay | Admitting: Primary Care

## 2016-10-05 ENCOUNTER — Other Ambulatory Visit: Payer: Self-pay | Admitting: General Surgery

## 2016-10-05 DIAGNOSIS — D224 Melanocytic nevi of scalp and neck: Secondary | ICD-10-CM | POA: Diagnosis not present

## 2016-10-05 DIAGNOSIS — Z1231 Encounter for screening mammogram for malignant neoplasm of breast: Secondary | ICD-10-CM

## 2016-10-05 DIAGNOSIS — M25571 Pain in right ankle and joints of right foot: Secondary | ICD-10-CM | POA: Diagnosis not present

## 2016-10-05 DIAGNOSIS — M25561 Pain in right knee: Secondary | ICD-10-CM | POA: Diagnosis not present

## 2016-10-05 DIAGNOSIS — L989 Disorder of the skin and subcutaneous tissue, unspecified: Secondary | ICD-10-CM | POA: Diagnosis not present

## 2016-10-07 ENCOUNTER — Other Ambulatory Visit: Payer: Self-pay | Admitting: Physician Assistant

## 2016-10-07 DIAGNOSIS — M25561 Pain in right knee: Secondary | ICD-10-CM

## 2016-10-13 ENCOUNTER — Ambulatory Visit: Payer: Self-pay | Admitting: Physician Assistant

## 2016-10-13 ENCOUNTER — Ambulatory Visit
Admission: RE | Admit: 2016-10-13 | Discharge: 2016-10-13 | Disposition: A | Discharge status: Home / Self Care | Payer: 59 | Source: Ambulatory Visit | Attending: Physician Assistant | Admitting: Physician Assistant

## 2016-10-13 ENCOUNTER — Encounter: Payer: Self-pay | Admitting: Physician Assistant

## 2016-10-13 VITALS — BP 118/82 | HR 72 | Temp 98.2°F

## 2016-10-13 DIAGNOSIS — S838X1A Sprain of other specified parts of right knee, initial encounter: Secondary | ICD-10-CM | POA: Diagnosis not present

## 2016-10-13 DIAGNOSIS — S83411A Sprain of medial collateral ligament of right knee, initial encounter: Secondary | ICD-10-CM | POA: Diagnosis not present

## 2016-10-13 DIAGNOSIS — M25561 Pain in right knee: Secondary | ICD-10-CM | POA: Diagnosis not present

## 2016-10-13 DIAGNOSIS — X58XXXA Exposure to other specified factors, initial encounter: Secondary | ICD-10-CM | POA: Diagnosis not present

## 2016-10-13 DIAGNOSIS — J069 Acute upper respiratory infection, unspecified: Secondary | ICD-10-CM

## 2016-10-13 LAB — POCT RAPID STREP A (OFFICE): RAPID STREP A SCREEN: NEGATIVE

## 2016-10-13 MED ORDER — FLUTICASONE PROPIONATE 50 MCG/ACT NA SUSP: 2.0000 | Freq: Every day | NASAL | 6 refills | Status: DC

## 2016-10-13 NOTE — Progress Notes (Signed)
S: C/o runny nose and congestion for 4-5 days, no fever, chills, cp/sob, v/d; throat is really sore, cough is dry and hacking, feels like its viral but wants to make sure she doesn't have strep  Using otc meds:   O: PE: vitals wnl, nad, perrl eomi, normocephalic, tms dull, nasal mucosa red and swollen, throat injected, neck supple no lymph, lungs c t a, cv rrr, neuro intact, q strep neg  A:  Acute viral uri   P: drink fluids, continue regular meds , use otc meds of choice, return if not improving in 5 days, return earlier if worsening , flonase for congestion

## 2016-10-16 ENCOUNTER — Other Ambulatory Visit: Payer: Self-pay | Admitting: Primary Care

## 2016-10-16 DIAGNOSIS — E538 Deficiency of other specified B group vitamins: Secondary | ICD-10-CM

## 2016-10-16 DIAGNOSIS — E559 Vitamin D deficiency, unspecified: Secondary | ICD-10-CM

## 2016-10-16 DIAGNOSIS — Z0000 Encounter for general adult medical examination without abnormal findings: Secondary | ICD-10-CM

## 2016-10-18 ENCOUNTER — Other Ambulatory Visit: Payer: Self-pay

## 2016-10-19 ENCOUNTER — Encounter: Payer: Self-pay | Admitting: *Deleted

## 2016-10-19 ENCOUNTER — Ambulatory Visit
Admission: EM | Admit: 2016-10-19 | Discharge: 2016-10-19 | Disposition: A | Discharge status: Home / Self Care | Payer: 59 | Attending: Family Medicine | Admitting: Family Medicine

## 2016-10-19 ENCOUNTER — Other Ambulatory Visit: Payer: Self-pay

## 2016-10-19 ENCOUNTER — Ambulatory Visit (INDEPENDENT_AMBULATORY_CARE_PROVIDER_SITE_OTHER): Payer: 59

## 2016-10-19 ENCOUNTER — Encounter: Payer: Self-pay | Admitting: Neurology

## 2016-10-19 DIAGNOSIS — R0602 Shortness of breath: Secondary | ICD-10-CM | POA: Diagnosis not present

## 2016-10-19 DIAGNOSIS — B9789 Other viral agents as the cause of diseases classified elsewhere: Secondary | ICD-10-CM

## 2016-10-19 DIAGNOSIS — J069 Acute upper respiratory infection, unspecified: Secondary | ICD-10-CM | POA: Diagnosis not present

## 2016-10-19 DIAGNOSIS — R05 Cough: Secondary | ICD-10-CM | POA: Diagnosis not present

## 2016-10-19 LAB — RAPID INFLUENZA A&B ANTIGENS: Influenza B (ARMC): NEGATIVE

## 2016-10-19 LAB — RAPID INFLUENZA A&B ANTIGENS (ARMC ONLY): INFLUENZA A (ARMC): NEGATIVE

## 2016-10-19 NOTE — ED Provider Notes (Signed)
[facility]    CSN: [id number] Arrival date & time: 10/19/16  [id number]     History   Chief Complaint Chief Complaint  Patient presents with  . Cough  . Shortness of Breath    HPI Jessica Dodson is a 50 y.o. female.    Cough  Associated symptoms: fever, myalgias and shortness of breath   Associated symptoms: no wheezing   Shortness of Breath  Associated symptoms: cough and fever   Associated symptoms: no wheezing   URI  Presenting symptoms: congestion, cough, fatigue and fever   Severity:  Moderate Onset quality:  Sudden Duration:  9 days Timing:  Constant Progression:  Worsening Chronicity:  New Relieved by:  Nothing Ineffective treatments:  OTC medications Associated symptoms: myalgias   Associated symptoms: no wheezing   Associated symptoms comment:  Shortness of breath Risk factors: sick contacts (through work)   Risk factors: not elderly, no chronic cardiac disease, no chronic kidney disease, no chronic respiratory disease, no diabetes mellitus, no immunosuppression, no recent illness and no recent travel     Past Medical History:  Diagnosis Date  . ADHD (attention deficit hyperactivity disorder)   . Allergy   . Cold sore   . Complication of anesthesia    hard to wake up after tonsillectomy and nasoseptoplasty  . Depression    recurrent, not on meds at this time  . Depression   . H/O alcohol abuse    19 years sober  . Headache    migraine (once a year) usually has an aura  . Migraine with aura   . Pneumonia 2016    Patient Active Problem List   Diagnosis Date Noted  . Essential tremor 07/21/2016  . Herpes labialis 02/24/2016  . Preventative health care 10/20/2015  . Breast mass in female 10/12/2015  . Depression 06/30/2015    Past Surgical History:  Procedure Laterality Date  . BREAST SURGERY Right    biopsy - benign  . COLONOSCOPY    . LIPOMA EXCISION Left    6 cm tumor  . MASS EXCISION Left 05/08/2015   Procedure: EXCISION OF  LEFT DELTOID MASS;  Surgeon: [name], MD;  Location: [place];  Service: General;  Laterality: Left;  . NASAL SEPTUM SURGERY    . TONSILLECTOMY    . urethral stricture surgery     at age 52    OB History    Gravida Para Term Preterm AB Living   0 0 0 0 0     SAB TAB Ectopic Multiple Live Births   0 0 0           Home Medications    Prior to Admission medications   Medication Sig Start Date End Date Taking? Authorizing Provider  aspirin 81 MG tablet Take 81 mg by mouth. Take 1 PO 4-5 days/week   Yes Historical Provider, MD  Cholecalciferol (VITAMIN D3) 5000 UNITS TABS Take 5,000 Units by mouth once a week.   Yes Historical Provider, MD  escitalopram (LEXAPRO) 10 MG tablet TAKE 1 TABLET (10 MG TOTAL) BY MOUTH DAILY. 08/25/16  Yes Pleas Koch, NP  fluticasone (FLONASE) 50 MCG/ACT nasal spray Place 2 sprays into both nostrils daily. 10/13/16  Yes Versie Starks, PA-C  valACYclovir (VALTREX) 1000 MG tablet Take 2 tablets by mouth twice daily for 1 day as needed for breakouts. 02/24/16  Yes Pleas Koch, NP  vitamin B-12 (CYANOCOBALAMIN) 500 MCG tablet Take 500 mcg by mouth daily. 1 Tablet  Po 4-5 days/week   Yes Historical Provider, MD  primidone (MYSOLINE) 50 MG tablet Take 1 tablet (50 mg total) by mouth at bedtime. Patient not taking: Reported on 10/13/2016 08/24/16   Star Age, MD    Family History Family History  Problem Relation Age of Onset  . Obesity Mother   . High Cholesterol Mother   . Arthritis Mother   . Hyperlipidemia Mother   . Hypertension Mother   . High Cholesterol Father   . Hypothyroidism Father   . Prostate cancer Father   . Hyperlipidemia Father   . Hypertension Father   . Prostate cancer Maternal Uncle   . Colon cancer Maternal Uncle   . Breast cancer Paternal Aunt   . Arthritis Maternal Grandmother   . Stroke Maternal Grandmother   . Hypertension Maternal Grandmother   . Mental illness Maternal Grandmother   . Hypertension Maternal  Grandfather   . Hyperlipidemia Paternal Grandmother   . Stroke Paternal Grandmother   . Prostate cancer Paternal Grandfather   . Hyperlipidemia Paternal Grandfather   . Heart disease Paternal Grandfather   . Hypertension Paternal Grandfather   . Prostate cancer Maternal Uncle   . Breast cancer Paternal Aunt   . Diabetes Paternal Uncle     Social History Social History  Substance Use Topics  . Smoking status: Never Smoker  . Smokeless tobacco: Never Used  . Alcohol use 0.0 oz/week     Comment: sober for 19 years     Allergies   Patient has no known allergies.   Review of Systems Review of Systems  Constitutional: Positive for fatigue and fever.  HENT: Positive for congestion.   Respiratory: Positive for cough and shortness of breath. Negative for wheezing.   Musculoskeletal: Positive for myalgias.     Physical Exam Triage Vital Signs ED Triage Vitals  Enc Vitals Group     BP 10/19/16 0901 123/78     Pulse Rate 10/19/16 0901 62     Resp 10/19/16 0901 16     Temp 10/19/16 0901 98.3 F (36.8 C)     Temp Source 10/19/16 0901 Oral     SpO2 10/19/16 0901 100 %     Weight 10/19/16 0904 142 lb (64.4 kg)     Height 10/19/16 0904 5\' 5"  (1.651 m)     Head Circumference --      Peak Flow --      Pain Score --      Pain Loc --      Pain Edu? --      Excl. in [place]? --    No data found.   Updated Vital Signs BP 123/78 (BP Location: Left Arm)   Pulse 62   Temp 98.3 F (36.8 C) (Oral)   Resp 16   Ht 5\' 5"  (1.651 m)   Wt 142 lb (64.4 kg)   LMP 09/27/2016 (Exact Date) Comment: denies preg  SpO2 100%   BMI 23.63 kg/m   Visual Acuity Right Eye Distance:   Left Eye Distance:   Bilateral Distance:    Right Eye Near:   Left Eye Near:    Bilateral Near:     Physical Exam  Constitutional: She appears well-developed and well-nourished. No distress.  HENT:  Head: Normocephalic and atraumatic.  Right Ear: Tympanic membrane, external ear and ear canal normal.  Left  Ear: Tympanic membrane, external ear and ear canal normal.  Nose: Mucosal edema and rhinorrhea present. No nose lacerations, sinus tenderness, nasal deformity, septal deviation  or nasal septal hematoma. No epistaxis.  No foreign bodies. Right sinus exhibits no maxillary sinus tenderness and no frontal sinus tenderness. Left sinus exhibits no maxillary sinus tenderness and no frontal sinus tenderness.  Mouth/Throat: Uvula is midline, oropharynx is clear and moist and mucous membranes are normal. No oropharyngeal exudate.  Eyes: Conjunctivae and EOM are normal. Pupils are equal, round, and reactive to light. Right eye exhibits no discharge. Left eye exhibits no discharge. No scleral icterus.  Neck: Normal range of motion. Neck supple. No thyromegaly present.  Cardiovascular: Normal rate, regular rhythm and normal heart sounds.   Pulmonary/Chest: Effort normal and breath sounds normal. No respiratory distress. She has no wheezes. She has no rales.  Lymphadenopathy:    She has no cervical adenopathy.  Skin: She is not diaphoretic.  Nursing note and vitals reviewed.    UC Treatments / Results  Labs (all labs ordered are listed, but only abnormal results are displayed) Labs Reviewed  RAPID INFLUENZA A&B ANTIGENS ([place])    EKG  EKG Interpretation None       Radiology Dg Chest 2 View  Result Date: 10/19/2016 CLINICAL DATA:  Cough with fever and dizziness ; shortness of breath EXAM: CHEST  2 VIEW COMPARISON:  None. FINDINGS: Lungs are clear. The heart size and pulmonary vascularity are normal. No adenopathy. No bone lesions. IMPRESSION: No edema or consolidation. Electronically Signed   By: Lowella Grip III M.D.   On: 10/19/2016 10:05    Procedures Procedures (including critical care time)  Medications Ordered in UC Medications - No data to display   Initial Impression / Assessment and Plan / UC Course  I have reviewed the triage vital signs and the nursing  notes.  Pertinent labs & imaging results that were available during my care of the patient were reviewed by me and considered in my medical decision making (see chart for details).  Clinical Course       Final Clinical Impressions(s) / UC Diagnoses   Final diagnoses:  Viral URI with cough    New Prescriptions Discharge Medication List as of 10/19/2016 10:15 AM     1. Labs/x-ray results (negative flu and chest x-ray) and diagnosis reviewed with patient 2.Recommend supportive treatment with rest, fluids, otc analgesics prn 3. Follow-up prn if symptoms worsen or don't improve   Norval Gable, MD 10/19/16 1321

## 2016-10-19 NOTE — ED Triage Notes (Signed)
Non-productive cough, dyspnea, body aches, x9 days. Was tested for strep last week and was negative. Hx of pneumonia.

## 2016-10-19 NOTE — Discharge Instructions (Signed)
Rest, fluids. 

## 2016-10-25 ENCOUNTER — Encounter: Payer: Self-pay | Admitting: Primary Care

## 2016-10-25 DIAGNOSIS — S86911A Strain of unspecified muscle(s) and tendon(s) at lower leg level, right leg, initial encounter: Secondary | ICD-10-CM | POA: Diagnosis not present

## 2016-10-25 DIAGNOSIS — S83421A Sprain of lateral collateral ligament of right knee, initial encounter: Secondary | ICD-10-CM | POA: Diagnosis not present

## 2016-10-26 ENCOUNTER — Encounter: Payer: Self-pay | Admitting: Primary Care

## 2016-11-02 ENCOUNTER — Ambulatory Visit: Payer: Self-pay

## 2016-11-23 ENCOUNTER — Encounter: Payer: Self-pay | Admitting: Primary Care

## 2016-12-01 ENCOUNTER — Other Ambulatory Visit: Payer: Self-pay | Admitting: Primary Care

## 2016-12-01 ENCOUNTER — Telehealth: Payer: Self-pay | Admitting: Primary Care

## 2016-12-01 DIAGNOSIS — D239 Other benign neoplasm of skin, unspecified: Secondary | ICD-10-CM

## 2016-12-01 NOTE — Telephone Encounter (Signed)
Received staff message from patient requesting dermatology referral due to recent dysplastic nevus. Referral placed for further evaluation of other nevi.

## 2016-12-07 ENCOUNTER — Encounter: Payer: Self-pay | Admitting: Neurology

## 2016-12-07 ENCOUNTER — Encounter: Payer: Self-pay | Admitting: Primary Care

## 2016-12-07 ENCOUNTER — Ambulatory Visit (INDEPENDENT_AMBULATORY_CARE_PROVIDER_SITE_OTHER): Payer: 59 | Admitting: Primary Care

## 2016-12-07 DIAGNOSIS — Z0000 Encounter for general adult medical examination without abnormal findings: Secondary | ICD-10-CM

## 2016-12-07 DIAGNOSIS — G250 Essential tremor: Secondary | ICD-10-CM

## 2016-12-07 DIAGNOSIS — E538 Deficiency of other specified B group vitamins: Secondary | ICD-10-CM | POA: Diagnosis not present

## 2016-12-07 DIAGNOSIS — F909 Attention-deficit hyperactivity disorder, unspecified type: Secondary | ICD-10-CM

## 2016-12-07 DIAGNOSIS — E559 Vitamin D deficiency, unspecified: Secondary | ICD-10-CM

## 2016-12-07 HISTORY — DX: Attention-deficit hyperactivity disorder, unspecified type: F90.9

## 2016-12-07 LAB — LIPID PANEL
CHOLESTEROL: 159 mg/dL (ref 0–200)
HDL: 58.2 mg/dL (ref >39.00)
LDL Cholesterol: 89 mg/dL (ref 0–99)
NonHDL: 100.9
Total CHOL/HDL Ratio: 3
Triglycerides: 62 mg/dL (ref 0.0–149.0)
VLDL: 12.4 mg/dL (ref 0.0–40.0)

## 2016-12-07 LAB — HEPATIC FUNCTION PANEL
ALBUMIN: 4.6 g/dL (ref 3.5–5.2)
ALT: 16 U/L (ref 0–35)
AST: 19 U/L (ref 0–37)
Alkaline Phosphatase: 60 U/L (ref 39–117)
Bilirubin, Direct: 0.1 mg/dL (ref 0.0–0.3)
Total Bilirubin: 0.8 mg/dL (ref 0.2–1.2)
Total Protein: 7.3 g/dL (ref 6.0–8.3)

## 2016-12-07 LAB — VITAMIN B12: VITAMIN B 12: 531 pg/mL (ref 211–911)

## 2016-12-07 LAB — VITAMIN D 25 HYDROXY (VIT D DEFICIENCY, FRACTURES): VITD: 29.17 ng/mL — AB (ref 30.00–100.00)

## 2016-12-07 NOTE — Assessment & Plan Note (Signed)
Long history of, never managed on medications. Feels she's able to control symptoms without medications.

## 2016-12-07 NOTE — Assessment & Plan Note (Signed)
Tetanus UTD per patient, she will update [place] once she finds out. Influenza UTD. Colonoscopy due this summer, will order. Pap UTD, mammogram pending. Commended her on her healthy lifestyle. Exam unremarkable. Labs pending. Follow up in 1 year for physical.

## 2016-12-07 NOTE — Progress Notes (Signed)
Pre visit review using our clinic review tool, if applicable. No additional management support is needed unless otherwise documented below in the visit note. 

## 2016-12-07 NOTE — Assessment & Plan Note (Signed)
Overall well managed on Lexapro, continue current regimen.

## 2016-12-07 NOTE — Assessment & Plan Note (Signed)
Following with neurology, managed on Primidone 50 mg, some improvement.

## 2016-12-07 NOTE — Patient Instructions (Addendum)
Complete lab work prior to leaving today. I will notify you of your results once received.   Please notify us of your last Tetanus vaccination.  Continue to lead a healthy lifestyle through diet and exercise.  Ensure you are consuming 64 ounces of water daily.  We will send a reminder for your colonoscopy later this summer.  Follow up in 1 year for your annual exam or sooner if needed.  It was a pleasure to see you today!

## 2016-12-07 NOTE — Progress Notes (Signed)
Subjective:    Patient ID: Jessica Dodson, female    DOB: 1966-06-03, 51 y.o.   MRN: [id number]  HPI  Jessica Dodson is a 51 year old female who presents today for complete physical.  Immunizations: -Tetanus: Completed in 2007, she will notify us for sure. -Influenza: Completed in November 2017  Diet: She endorses a healthy diet. Breakfast: Blueberry bagel, peanut butter, dried fruit, oatmeal Lunch: Left overs Dinner: Vegetables, fruit, whole grains, legumes Snacks: Occasionally, popcorn, pita chips Desserts: Occasionally Beverages: Water, propel, coffee   Exercise: Daily. Run. Eye exam: Completed in October 2017. Dental exam: Completes semi-annually Colonoscopy: Completed in 2012, due in Summer 2018. Pap Smear: Completed in 2016, due 2019 Mammogram: Pending, due.    Review of Systems  Constitutional: Negative for unexpected weight change.  HENT: Negative for rhinorrhea.   Respiratory: Negative for cough and shortness of breath.   Cardiovascular: Negative for chest pain.  Gastrointestinal: Negative for constipation and diarrhea.  Genitourinary: Negative for difficulty urinating and menstrual problem.  Musculoskeletal: Negative for arthralgias and myalgias.  Skin: Negative for rash.       Recent dysplastic nevus removed, will be seeing dermatologist  Allergic/Immunologic: Negative for environmental allergies.  Neurological: Negative for dizziness, numbness and headaches.  Psychiatric/Behavioral:       Well managed on Lexapro.        Past Medical History:  Diagnosis Date  . ADHD (attention deficit hyperactivity disorder)   . Allergy   . Cold sore   . Complication of anesthesia    hard to wake up after tonsillectomy and nasoseptoplasty  . Depression    recurrent, not on meds at this time  . Depression   . H/O alcohol abuse    19 years sober  . Headache    migraine (once a year) usually has an aura  . Migraine with aura   . Pneumonia 2016     Social History    Social History  . Marital status: Married    Spouse name: N/A  . Number of children: 0  . Years of education: MD   Occupational History  . Not on file.   Social History Main Topics  . Smoking status: Never Smoker  . Smokeless tobacco: Never Used  . Alcohol use 0.0 oz/week     Comment: sober for 19 years  . Drug use: No  . Sexual activity: Not on file   Other Topics Concern  . Not on file   Social History Narrative   ** Merged History Encounter **       ** Data from: 05/12/15 Enc Dept: [place]       ** Data from: 06/30/15 Enc Dept: [facility]   Married. 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  Live in [place].  Enjoys running, mountain bike, kayak.     Past Surgical History:  Procedure Laterality Date  . BREAST SURGERY Right    biopsy - benign  . COLONOSCOPY    . LIPOMA EXCISION Left    6 cm tumor  . MASS EXCISION Left 05/08/2015   Procedure: EXCISION OF LEFT DELTOID MASS;  Surgeon: [name], MD;  Location: [place] [facility];  Service: General;  Laterality: Left;  . NASAL SEPTUM SURGERY    . TONSILLECTOMY    . urethral stricture surgery     at age 73    Family History  Problem Relation Age of Onset  . Obesity Mother   . High Cholesterol Mother   .  Arthritis Mother   . Hyperlipidemia Mother   . Hypertension Mother   . High Cholesterol Father   . Hypothyroidism Father   . Prostate cancer Father   . Hyperlipidemia Father   . Hypertension Father   . Prostate cancer Maternal Uncle   . Colon cancer Maternal Uncle   . Breast cancer Paternal Aunt   . Arthritis Maternal Grandmother   . Stroke Maternal Grandmother   . Hypertension Maternal Grandmother   . Mental illness Maternal Grandmother   . Hypertension Maternal Grandfather   . Hyperlipidemia Paternal Grandmother   . Stroke Paternal Grandmother   . Prostate cancer Paternal Grandfather   . Hyperlipidemia Paternal Grandfather   . Heart disease Paternal Grandfather   .  Hypertension Paternal Grandfather   . Prostate cancer Maternal Uncle   . Breast cancer Paternal Aunt   . Diabetes Paternal Uncle     No Known Allergies  Current Outpatient Prescriptions on File Prior to Visit  Medication Sig Dispense Refill  . aspirin 81 MG tablet Take 81 mg by mouth. Take 1 PO 4-5 days/week    . Cholecalciferol (VITAMIN D3) 5000 UNITS TABS Take 5,000 Units by mouth once a week.    . escitalopram (LEXAPRO) 10 MG tablet TAKE 1 TABLET (10 MG TOTAL) BY MOUTH DAILY. 30 tablet 5  . primidone (MYSOLINE) 50 MG tablet Take 1 tablet (50 mg total) by mouth at bedtime. 30 tablet 5  . valACYclovir (VALTREX) 1000 MG tablet Take 2 tablets by mouth twice daily for 1 day as needed for breakouts. 30 tablet 3  . vitamin B-12 (CYANOCOBALAMIN) 500 MCG tablet Take 500 mcg by mouth daily. 1 Tablet Po 4-5 days/week     No current facility-administered medications on file prior to visit.     BP 118/76   Pulse (!) 51   Temp 98.1 F (36.7 C) (Oral)   Ht 5\' 5"  (1.651 m)   Wt 135 lb 1.9 oz (61.3 kg)   LMP 11/21/2016   SpO2 99%   BMI 22.49 kg/m    Objective:   Physical Exam  Constitutional: She is oriented to person, place, and time. She appears well-nourished.  HENT:  Right Ear: Tympanic membrane and ear canal normal.  Left Ear: Tympanic membrane and ear canal normal.  Nose: Nose normal.  Mouth/Throat: Oropharynx is clear and moist.  Eyes: Conjunctivae and EOM are normal. Pupils are equal, round, and reactive to light.  Neck: Neck supple. No thyromegaly present.  Cardiovascular: Normal rate and regular rhythm.   No murmur heard. Pulmonary/Chest: Effort normal and breath sounds normal. She has no rales.  Abdominal: Soft. Bowel sounds are normal. There is no tenderness.  Musculoskeletal: Normal range of motion.  Lymphadenopathy:    She has no cervical adenopathy.  Neurological: She is alert and oriented to person, place, and time. She has normal reflexes. No cranial nerve deficit.   Mild essential tremor to bilateral hands, more prominent to right  Skin: Skin is warm and dry. No rash noted.  Numerous nevi, recent removed nevus site unremarkable.   Psychiatric: She has a normal mood and affect.          Assessment & Plan:

## 2016-12-08 ENCOUNTER — Telehealth: Payer: Self-pay | Admitting: Neurology

## 2016-12-08 MED ORDER — PRIMIDONE 50 MG PO TABS: 50.0000 mg | ORAL_TABLET | Freq: Two times a day (BID) | ORAL | 5 refills | Status: DC

## 2016-12-08 NOTE — Telephone Encounter (Signed)
Per email conversation, will gradually increase mysoline for ET. Rx adjusted. Patient notified via email, no action required.

## 2016-12-15 ENCOUNTER — Other Ambulatory Visit: Payer: Self-pay

## 2016-12-15 MED ORDER — PRIMIDONE 50 MG PO TABS: 50.0000 mg | ORAL_TABLET | Freq: Two times a day (BID) | ORAL | 5 refills | Status: DC

## 2016-12-15 NOTE — Telephone Encounter (Signed)
Received notice from [facility] employee pharmacy that pt needs the myosoline prescription to go to them instead of [facility]. Rx refilled, sent to [facility].

## 2017-01-18 DIAGNOSIS — Z86018 Personal history of other benign neoplasm: Secondary | ICD-10-CM | POA: Diagnosis not present

## 2017-01-18 DIAGNOSIS — D225 Melanocytic nevi of trunk: Secondary | ICD-10-CM | POA: Diagnosis not present

## 2017-01-18 DIAGNOSIS — Z808 Family history of malignant neoplasm of other organs or systems: Secondary | ICD-10-CM | POA: Diagnosis not present

## 2017-01-18 DIAGNOSIS — L570 Actinic keratosis: Secondary | ICD-10-CM | POA: Diagnosis not present

## 2017-01-18 DIAGNOSIS — D485 Neoplasm of uncertain behavior of skin: Secondary | ICD-10-CM | POA: Diagnosis not present

## 2017-01-23 ENCOUNTER — Telehealth: Payer: Self-pay

## 2017-01-23 DIAGNOSIS — F3342 Major depressive disorder, recurrent, in full remission: Secondary | ICD-10-CM

## 2017-01-23 MED ORDER — ESCITALOPRAM OXALATE 10 MG PO TABS: 10.0000 mg | ORAL_TABLET | Freq: Every day | ORAL | 2 refills | Status: DC

## 2017-01-23 NOTE — Telephone Encounter (Signed)
Noted, will refill Lexapro for subsequent months. Which pharmacy? I see [facility] in the chart yet Rena mentions [facility] pharmacy. Let me know and I'll send more refills.

## 2017-01-23 NOTE — Telephone Encounter (Signed)
Noted, RX sent.

## 2017-01-23 NOTE — Telephone Encounter (Signed)
Spoken and notified patient of Kate's comments. Patient stated that please send to [facility]. Patient stated to let Anda Kraft know that she appreciated.

## 2017-01-23 NOTE — Telephone Encounter (Signed)
I spoke with Jessica Dodson at [facility] and pt has rx for lexapro ready for pick up and pt still has one available refill.  I spoke with pt and advised of above info and pt voiced understanding; nothing further needed. Pt will cb if needed.

## 2017-01-23 NOTE — Telephone Encounter (Signed)
PLEASE NOTE: All timestamps contained within this report are represented as [place] Standard Time. CONFIDENTIALTY NOTICE: This fax transmission is intended only for the addressee. It contains information that is legally privileged, confidential or otherwise protected from use or disclosure. If you are not the intended recipient, you are strictly prohibited from reviewing, disclosing, copying using or disseminating any of this information or taking any action in reliance on or regarding this information. If you have received this fax in error, please notify us immediately by telephone so that we can arrange for its return to [place]. Phone: [phone], Toll-Free: [phone], Fax: [phone] Page: 1 of 2 Call Id: [id number] [place] Patient Name: Jessica Dodson Gender: Female DOB: [birth year] Age: 51 Y 79 M 10 D Return Phone Number: [phone] (Primary) City/State/Zip: [place] Client [place] Night - Client Client Site [place] Physician Alma Friendly - NP Who Is Calling Patient / Member / Family / Caregiver Call Type Triage / Clinical Relationship To Patient Self Return Phone Number [phone] (Primary) Chief Complaint Depression Reason for Call Symptomatic / Request for [place] has run out of antidepressants, needs a few pills to last until Monday. Nurse Assessment Nurse: Melina Modena, RN, [place] Date/Time Eilene Ghazi Time): 01/21/2017 11:14:18 AM Confirm and document reason for call. If symptomatic, describe symptoms. ---caller states out of anti depressants, no thoughts of self harm, tearful. Needs Lexapro 10 mg once a day , Alma Friendly prescriber. Does the PT have any chronic conditions? (i.e. diabetes, asthma, etc.) ---Yes List chronic conditions. ---depression, tremor, Is the patient pregnant or  possibly pregnant? (Ask all females between the ages of 50-55) ---No Nurse: [place], RN, [place] Date/Time (Eastern Time): 01/21/2017 11:24:40 AM Please select the assessment type ---Refill Additional Documentation ---Needs refills for Lexapro 10 mg once daily, nkda, [facility] [phone] Does the patient have enough medication to last until the office opens? ---No Guidelines Guideline Title Affirmed Question Depression [1] Depression AND [2] worsening (e.g.,sleeping poorly, less able to do activities of daily living) Disp. Time Eilene Ghazi Time) Disposition Final User 01/21/2017 11:24:16 AM See Physician within 24 Hours Yes Melina Modena, RN, [place] UNDECIDED Care Advice Given Per Guideline SEE PHYSICIAN WITHIN 24 HOURS: * IF OFFICE WILL BE CLOSED AND NO PCP TRIAGE: You need to be seen within the next 24 hours. An urgent care center is often a good source of care if your doctor's office is closed. REASSURANCE: * People with PLEASE NOTE: All timestamps contained within this report are represented as [place] Standard Time. CONFIDENTIALTY NOTICE: This fax transmission is intended only for the addressee. It contains information that is legally privileged, confidential or otherwise protected from use or disclosure. If you are not the intended recipient, you are strictly prohibited from reviewing, disclosing, copying using or disseminating any of this information or taking any action in reliance on or regarding this information. If you have received this fax in error, please notify us immediately by telephone so that we can arrange for its return to [place]. Phone: [phone], Toll-Free: [phone], Fax: [phone] Page: 2 of 2 Call Id: [id number] Care Advice Given Per Guideline depression do get through this -- even people who feel as badly as you feel now. You can be helped. CALL BACK IF: * You feel like harming yourself * You become worse. CARE ADVICE given per Depression (Adult)  guideline. Comments User: [place],  [place], RN Date/Time (Eastern Time): 01/21/2017 11:32:06 AM Spoke with pt, informed her of information, indicated understanding. Going to go talk to someone today or tomorrow and get an apt with Alma Friendly this upcoming week

## 2017-02-22 ENCOUNTER — Ambulatory Visit: Payer: Self-pay | Admitting: Neurology

## 2017-02-23 ENCOUNTER — Other Ambulatory Visit: Payer: Self-pay | Admitting: Primary Care

## 2017-02-23 ENCOUNTER — Ambulatory Visit (INDEPENDENT_AMBULATORY_CARE_PROVIDER_SITE_OTHER): Payer: 59 | Admitting: Primary Care

## 2017-02-23 ENCOUNTER — Ambulatory Visit: Payer: Self-pay | Admitting: Primary Care

## 2017-02-23 ENCOUNTER — Encounter: Payer: Self-pay | Admitting: Primary Care

## 2017-02-23 ENCOUNTER — Telehealth: Payer: Self-pay

## 2017-02-23 ENCOUNTER — Ambulatory Visit
Admission: RE | Admit: 2017-02-23 | Discharge: 2017-02-23 | Disposition: A | Discharge status: Home / Self Care | Payer: 59 | Source: Ambulatory Visit | Attending: Primary Care | Admitting: Primary Care

## 2017-02-23 VITALS — BP 124/82 | HR 56 | Temp 98.1°F | Ht 65.0 in | Wt 135.8 lb

## 2017-02-23 DIAGNOSIS — N63 Unspecified lump in unspecified breast: Secondary | ICD-10-CM | POA: Diagnosis not present

## 2017-02-23 DIAGNOSIS — R600 Localized edema: Secondary | ICD-10-CM

## 2017-02-23 DIAGNOSIS — M7989 Other specified soft tissue disorders: Secondary | ICD-10-CM | POA: Diagnosis not present

## 2017-02-23 LAB — BASIC METABOLIC PANEL
BUN: 12 mg/dL (ref 6–23)
CALCIUM: 9.4 mg/dL (ref 8.4–10.5)
CO2: 31 meq/L (ref 19–32)
Chloride: 102 mEq/L (ref 96–112)
Creatinine, Ser: 0.68 mg/dL (ref 0.40–1.20)
GFR: 97.43 mL/min (ref >60.00)
GLUCOSE: 86 mg/dL (ref 70–99)
POTASSIUM: 4.5 meq/L (ref 3.5–5.1)
SODIUM: 140 meq/L (ref 135–145)

## 2017-02-23 LAB — CBC WITH DIFFERENTIAL/PLATELET
BASOS PCT: 0.9 % (ref 0.0–3.0)
Basophils Absolute: 0.1 10*3/uL (ref 0.0–0.1)
EOS PCT: 0.9 % (ref 0.0–5.0)
Eosinophils Absolute: 0.1 10*3/uL (ref 0.0–0.7)
HCT: 38.4 % (ref 36.0–46.0)
HEMOGLOBIN: 12.9 g/dL (ref 12.0–15.0)
LYMPHS ABS: 1.4 10*3/uL (ref 0.7–4.0)
Lymphocytes Relative: 22.2 % (ref 12.0–46.0)
MCHC: 33.6 g/dL (ref 30.0–36.0)
MCV: 95.8 fl (ref 78.0–100.0)
MONOS PCT: 4.9 % (ref 3.0–12.0)
Monocytes Absolute: 0.3 10*3/uL (ref 0.1–1.0)
Neutro Abs: 4.4 10*3/uL (ref 1.4–7.7)
Neutrophils Relative %: 71.1 % (ref 43.0–77.0)
Platelets: 248 10*3/uL (ref 150.0–400.0)
RBC: 4.01 Mil/uL (ref 3.87–5.11)
RDW: 13.2 % (ref 11.5–15.5)
WBC: 6.2 10*3/uL (ref 4.0–10.5)

## 2017-02-23 NOTE — Telephone Encounter (Signed)
Report was reviewed at time of notification, patient notified.

## 2017-02-23 NOTE — Patient Instructions (Addendum)
Stop by the front desk and speak with either Rosaria Ferries or Shirlean Mylar regarding your mammogram and ultrasound.   Complete lab work prior to leaving today. I will notify you of your results once received.   It was a pleasure to see you today!

## 2017-02-23 NOTE — Assessment & Plan Note (Signed)
New mass to left breast as mentioned. Exam today with suspicion for cyst involvement. Bilateral diagnostic mammogram with left upper extremity ultrasound pending. No alarm signs noted.

## 2017-02-23 NOTE — Progress Notes (Signed)
Subjective:    Patient ID: Jessica Dodson, female    DOB: 1966/07/13, 51 y.o.   MRN: [id number]  HPI  Jessica Dodson is a 51 year old female who presents today with multiple complaints.  1) Breast Mass: History of densities and lumps to her breast. The most recent mass is located to the left breast near the areola at the 10 o'clock position which she noticed recently. She denies changes in skin texture, pain. She is overdue for her screening mammogram.  2) Upper Extremity Edema: Located to the left upper extremity starting from the axillary line down to the left olecranon. She first noticed the swelling Monday this week. She's also noticed aching and tightness to her skin. She denies trauma, erythema, long periods of inactivity, palpitations, shortness of breath.  Review of Systems  Respiratory: Negative for shortness of breath.   Cardiovascular: Negative for palpitations.  Genitourinary:       Left breast mass  Skin: Negative for color change.       Edema to left upper extremity       Past Medical History:  Diagnosis Date  . ADHD (attention deficit hyperactivity disorder)   . Allergy   . Cold sore   . Complication of anesthesia    hard to wake up after tonsillectomy and nasoseptoplasty  . Depression    recurrent, not on meds at this time  . Depression   . H/O alcohol abuse    19 years sober  . Headache    migraine (once a year) usually has an aura  . Migraine with aura   . Pneumonia 2016     Social History   Social History  . Marital status: Married    Spouse name: N/A  . Number of children: 0  . Years of education: MD   Occupational History  . Not on file.   Social History Main Topics  . Smoking status: Never Smoker  . Smokeless tobacco: Never Used  . Alcohol use 0.0 oz/week     Comment: sober for 19 years  . Drug use: No  . Sexual activity: Not on file   Other Topics Concern  . Not on file   Social History Narrative   ** Merged History Encounter **       ** Data from: 05/12/15 Enc Dept: [place]       ** Data from: 06/30/15 Enc Dept: [facility]   Married. 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  Enjoys running, mountain bike, kayak.     Past Surgical History:  Procedure Laterality Date  . BREAST SURGERY Right    biopsy - benign  . COLONOSCOPY    . LIPOMA EXCISION Left    6 cm tumor  . MASS EXCISION Left 05/08/2015   Procedure: EXCISION OF LEFT DELTOID MASS;  Surgeon: [name], MD;  Location: [facility];  Service: General;  Laterality: Left;  . NASAL SEPTUM SURGERY    . TONSILLECTOMY    . urethral stricture surgery     at age 51    Family History  Problem Relation Age of Onset  . Obesity Mother   . High Cholesterol Mother   . Arthritis Mother   . Hyperlipidemia Mother   . Hypertension Mother   . High Cholesterol Father   . Hypothyroidism Father   . Prostate cancer Father   . Hyperlipidemia Father   . Hypertension Father   . Prostate cancer Maternal  Uncle   . Colon cancer Maternal Uncle   . Breast cancer Paternal Aunt   . Arthritis Maternal Grandmother   . Stroke Maternal Grandmother   . Hypertension Maternal Grandmother   . Mental illness Maternal Grandmother   . Hypertension Maternal Grandfather   . Hyperlipidemia Paternal Grandmother   . Stroke Paternal Grandmother   . Prostate cancer Paternal Grandfather   . Hyperlipidemia Paternal Grandfather   . Heart disease Paternal Grandfather   . Hypertension Paternal Grandfather   . Prostate cancer Maternal Uncle   . Breast cancer Paternal Aunt   . Diabetes Paternal Uncle     No Known Allergies  Current Outpatient Prescriptions on File Prior to Visit  Medication Sig Dispense Refill  . aspirin 81 MG tablet Take 81 mg by mouth. Take 1 PO 4-5 days/week    . Cholecalciferol (VITAMIN D3) 5000 UNITS TABS Take 5,000 Units by mouth once a week.    . escitalopram (LEXAPRO) 10 MG tablet Take 1 tablet (10 mg total) by  mouth daily. 90 tablet 2  . primidone (MYSOLINE) 50 MG tablet Take 1 tablet (50 mg total) by mouth 2 (two) times daily. 60 tablet 5  . valACYclovir (VALTREX) 1000 MG tablet Take 2 tablets by mouth twice daily for 1 day as needed for breakouts. 30 tablet 3  . vitamin B-12 (CYANOCOBALAMIN) 500 MCG tablet Take 500 mcg by mouth daily. 1 Tablet Po 4-5 days/week     No current facility-administered medications on file prior to visit.     BP 124/82   Pulse (!) 56   Temp 98.1 F (36.7 C) (Oral)   Ht 5\' 5"  (1.651 m)   Wt 135 lb 12.8 oz (61.6 kg)   LMP 02/17/2017   SpO2 99%   BMI 22.60 kg/m    Objective:   Physical Exam  Constitutional: She appears well-nourished.  Neck: Neck supple.  Cardiovascular: Normal rate and regular rhythm.   Pulmonary/Chest: Effort normal and breath sounds normal.    Numerous lumps throughout, new mass to 10 o'clock position near areola.   Skin: Skin is warm and dry. No erythema.  Moderate erythema to left upper extremity from level of left axilla down to left olecranon.          Assessment & Plan:  Upper Extremity Edema:  Located to left upper extremity x 3 days. Low risk for PE/DVT given history and activity level but based off of presentation will obtain upper extremity ultrasound to rule out clotting. Also check CBC, BMP today. May need CT, will await ultrasound results.  Sheral Flow, NP

## 2017-02-23 NOTE — Progress Notes (Signed)
Pre visit review using our clinic review tool, if applicable. No additional management support is needed unless otherwise documented below in the visit note. 

## 2017-02-23 NOTE — Telephone Encounter (Signed)
[facility] [place] called report for US Venous Img Upper Uni Left; report in Epic and paper report taken to Allie Bossier NP. Report was negative and pt is not waiting.

## 2017-02-24 ENCOUNTER — Other Ambulatory Visit: Payer: Self-pay | Admitting: Primary Care

## 2017-02-24 DIAGNOSIS — M7989 Other specified soft tissue disorders: Secondary | ICD-10-CM

## 2017-02-27 ENCOUNTER — Ambulatory Visit
Admission: RE | Admit: 2017-02-27 | Discharge: 2017-02-27 | Disposition: A | Discharge status: Home / Self Care | Payer: 59 | Source: Ambulatory Visit | Attending: Primary Care | Admitting: Primary Care

## 2017-02-27 ENCOUNTER — Ambulatory Visit (INDEPENDENT_AMBULATORY_CARE_PROVIDER_SITE_OTHER)
Admission: RE | Admit: 2017-02-27 | Discharge: 2017-02-27 | Disposition: A | Discharge status: Home / Self Care | Payer: 59 | Source: Ambulatory Visit | Attending: Primary Care | Admitting: Primary Care

## 2017-02-27 DIAGNOSIS — N63 Unspecified lump in unspecified breast: Secondary | ICD-10-CM

## 2017-02-27 DIAGNOSIS — M7989 Other specified soft tissue disorders: Secondary | ICD-10-CM | POA: Diagnosis not present

## 2017-02-27 DIAGNOSIS — N6489 Other specified disorders of breast: Secondary | ICD-10-CM | POA: Diagnosis not present

## 2017-02-27 DIAGNOSIS — R928 Other abnormal and inconclusive findings on diagnostic imaging of breast: Secondary | ICD-10-CM | POA: Diagnosis not present

## 2017-02-27 MED ORDER — IOPAMIDOL (ISOVUE-300) INJECTION 61%
80.0000 mL | Freq: Once | INTRAVENOUS | Status: AC | PRN
  Administered 2017-02-27: 80 mL via INTRAVENOUS

## 2017-05-15 ENCOUNTER — Encounter: Payer: Self-pay | Admitting: Primary Care

## 2017-05-17 ENCOUNTER — Ambulatory Visit: Payer: Self-pay | Admitting: Neurology

## 2017-05-24 ENCOUNTER — Telehealth: Payer: Self-pay

## 2017-05-24 NOTE — Telephone Encounter (Signed)
Pt calls; in 07/2017 pt will be traveling to [place]. Pt wants to know what vaccines pt should have prior to traveling. Pt will contact travel center and then if any vaccines needed at our office pt will cb.

## 2017-06-09 ENCOUNTER — Ambulatory Visit (INDEPENDENT_AMBULATORY_CARE_PROVIDER_SITE_OTHER): Payer: 59 | Admitting: Internal Medicine

## 2017-06-09 DIAGNOSIS — Z9189 Other specified personal risk factors, not elsewhere classified: Secondary | ICD-10-CM

## 2017-06-09 DIAGNOSIS — Z23 Encounter for immunization: Secondary | ICD-10-CM | POA: Diagnosis not present

## 2017-06-09 DIAGNOSIS — Z789 Other specified health status: Secondary | ICD-10-CM

## 2017-06-09 DIAGNOSIS — Z7189 Other specified counseling: Secondary | ICD-10-CM | POA: Diagnosis not present

## 2017-06-09 DIAGNOSIS — Z7184 Encounter for health counseling related to travel: Secondary | ICD-10-CM

## 2017-06-09 MED ORDER — AZITHROMYCIN 500 MG PO TABS: 500.0000 mg | ORAL_TABLET | Freq: Every day | ORAL | 0 refills | Status: DC

## 2017-06-09 MED ORDER — TYPHOID VACCINE PO CPDR: 1.0000 | DELAYED_RELEASE_CAPSULE | ORAL | 0 refills | Status: DC

## 2017-06-09 MED ORDER — ATOVAQUONE-PROGUANIL HCL 250-100 MG PO TABS: 1.0000 | ORAL_TABLET | Freq: Every day | ORAL | 0 refills | Status: DC

## 2017-06-09 NOTE — Patient Instructions (Signed)
   Remember to register your itinerary with STEP program through [place] state dept: [email or web address]  Also consider getting the following app which can expedite your customs /re-entry to the [place]: mobile passport app

## 2017-06-09 NOTE — Progress Notes (Signed)
Subjective:   Jessica Dodson is a 51 y.o. female who presents to the Infectious Disease clinic for travel consultation. Planned departure date: sept 2nd    Planned return date: sept 18 Countries of travel: [place] Areas in country: rural- going on safari x 14d  Accommod[place]ging Purpose of travel: vacation Prior travel out of [place]:  no  Update on work related vaccines including flu - MD- FP in [place]   Objective:   Medications: escitalopram, primidone All: NKMA   Assessment:   No contraindications to travel. none   Plan:   Pre travel counseling and vaccines = will give hep A #1 today as well as tdap. She will also take oral typhoid  Malaria proph = will give rx for malarone, gave instructions how to take medicaiton plus use DEET and premethrin  Traveler's diarrhea = will give rx for azithromycin to use if needed. Gave precautions  Recommend to [place]

## 2017-10-03 ENCOUNTER — Other Ambulatory Visit: Payer: Self-pay | Admitting: Primary Care

## 2017-10-03 DIAGNOSIS — B001 Herpesviral vesicular dermatitis: Secondary | ICD-10-CM

## 2017-10-05 ENCOUNTER — Telehealth: Payer: Self-pay | Admitting: Primary Care

## 2017-10-05 NOTE — Telephone Encounter (Signed)
Pt called regarding previous conversion (05/2017) Allie Bossier, NP about colon cancer screening; pt is requesting if colo-guard for routine screening instead of a colonospy; pt can be reached on her cell phone [phone]; will route to [place] pool for further instruction.

## 2017-10-06 NOTE — Telephone Encounter (Signed)
Noted, approve cologuard, please sign up.

## 2017-10-06 NOTE — Telephone Encounter (Addendum)
Per DPR, left detail message of Jessica Dodson's comments for patient to that will send order for cologuard for patient.    Also notified patient that if she questions to send a message through [place] or call.

## 2017-12-13 ENCOUNTER — Encounter: Payer: Self-pay | Admitting: Primary Care

## 2017-12-18 ENCOUNTER — Ambulatory Visit: Payer: Self-pay | Admitting: Nurse Practitioner

## 2017-12-18 VITALS — BP 139/64 | HR 80 | Temp 98.6°F | Wt 131.0 lb

## 2017-12-18 DIAGNOSIS — R6889 Other general symptoms and signs: Secondary | ICD-10-CM

## 2017-12-18 DIAGNOSIS — R509 Fever, unspecified: Secondary | ICD-10-CM

## 2017-12-18 LAB — POCT INFLUENZA A/B
INFLUENZA A, POC: NEGATIVE
Influenza B, POC: NEGATIVE

## 2017-12-18 NOTE — Patient Instructions (Addendum)

## 2017-12-18 NOTE — Progress Notes (Signed)
Subjective:  Jessica Dodson is a 52 y.o. female who presents for evaluation of URI like symptoms.  Symptoms include chills, fever: suspected fevers but not measured at home and post nasal drip.  Onset of symptoms was a few hours ago, and has been unchanged since that time.  Treatment to date:  cough suppressants.  High risk factors for influenza complications:  none.  The following portions of the patient's history were reviewed and updated as appropriate:  allergies, current medications and past medical history.  Constitutional: positive for chills, fevers and malaise, negative for anorexia and sweats Eyes: negative Ears, nose, mouth, throat, and face: positive for nasal congestion and sore throat, negative for ear drainage and earaches Respiratory: positive for cough, negative for asthma, dyspnea on exertion, sputum and wheezing Cardiovascular: negative Neurological: positive for headaches, negative for coordination problems, paresthesia, speech problems, vertigo and weakness Allergic/Immunologic: negative Objective:  BP 139/64   Pulse 80   Temp 98.6 F (37 C)   Wt 131 lb (59.4 kg)   SpO2 99%   BMI 21.80 kg/m  General appearance: alert, cooperative, fatigued and no distress Head: Normocephalic, without obvious abnormality, atraumatic Eyes: conjunctivae/corneas clear. PERRL, EOM's intact. Fundi benign. Ears: normal TM's and external ear canals both ears Nose: clear discharge, turbinates swollen, inflamed, no sinus tenderness Throat: abnormal findings: mild oropharyngeal erythema Lungs: clear to auscultation bilaterally Heart: regular rate and rhythm, S1, S2 normal, no murmur, click, rub or gallop Abdomen: soft, non-tender; bowel sounds normal; no masses,  no organomegaly Pulses: 2+ and symmetric Skin: Skin color, texture, turgor normal. No rashes or lesions Lymph nodes: cervical lymphadenopathy bilaterally Neurologic: Grossly normal    Assessment:  influenza like syndrome     Plan:  Discussed diagnosis and treatment of influenza. Discussed the importance of avoiding unnecessary antibiotic therapy. Educational material distributed and questions answered. Suggested symptomatic OTC remedies. Supportive care with appropriate antipyretics and fluids. Follow up as needed.

## 2017-12-20 ENCOUNTER — Other Ambulatory Visit: Payer: Self-pay | Admitting: Primary Care

## 2017-12-20 ENCOUNTER — Telehealth: Payer: Self-pay

## 2017-12-20 DIAGNOSIS — F3342 Major depressive disorder, recurrent, in full remission: Secondary | ICD-10-CM

## 2017-12-20 NOTE — Telephone Encounter (Signed)
Follow-up call for visit on 12/18/2017  Patient has laryngitis unable to completely understand her. But been having a .  fever 101. But appreciated follow up call.  Instructed patient to contact us if she needed to come back into office

## 2017-12-22 ENCOUNTER — Encounter: Payer: Self-pay | Admitting: Primary Care

## 2017-12-22 IMAGING — MG 2D DIGITAL DIAGNOSTIC BILATERAL MAMMOGRAM WITH CAD AND ADJUNCT T
8 of 15 series · 8 of 35 positions shown · non-contrast
Comparison: Prior studies including 10/13/2015 PE

CLINICAL DATA: The patient has a palpable mass in the left breast,
noted 1 month ago. History of left breast cyst aspiration and benign
biopsy of the right breast.

EXAM:
2D DIGITAL DIAGNOSTIC BILATERAL MAMMOGRAM WITH CAD AND ADJUNCT TOMO
ULTRASOUND LEFT BREAST

[L MLO synth-2D]
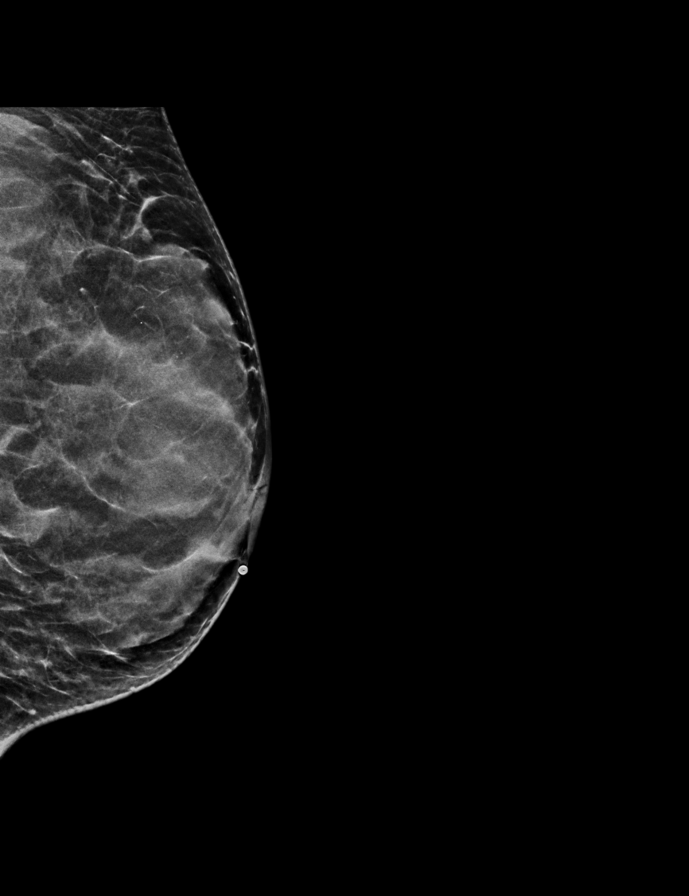

[L TAN]
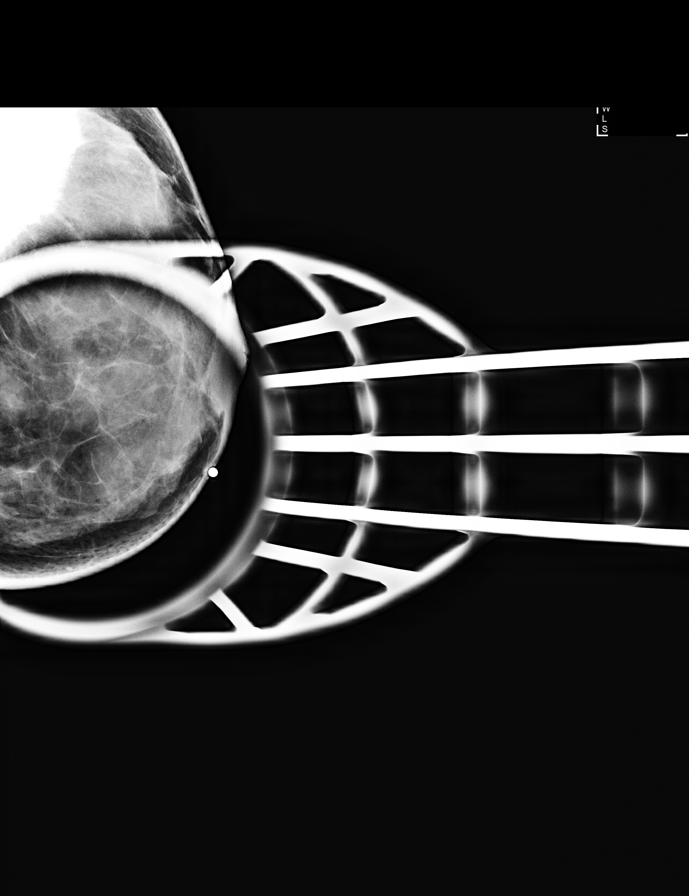

[L TAN synth-2D]
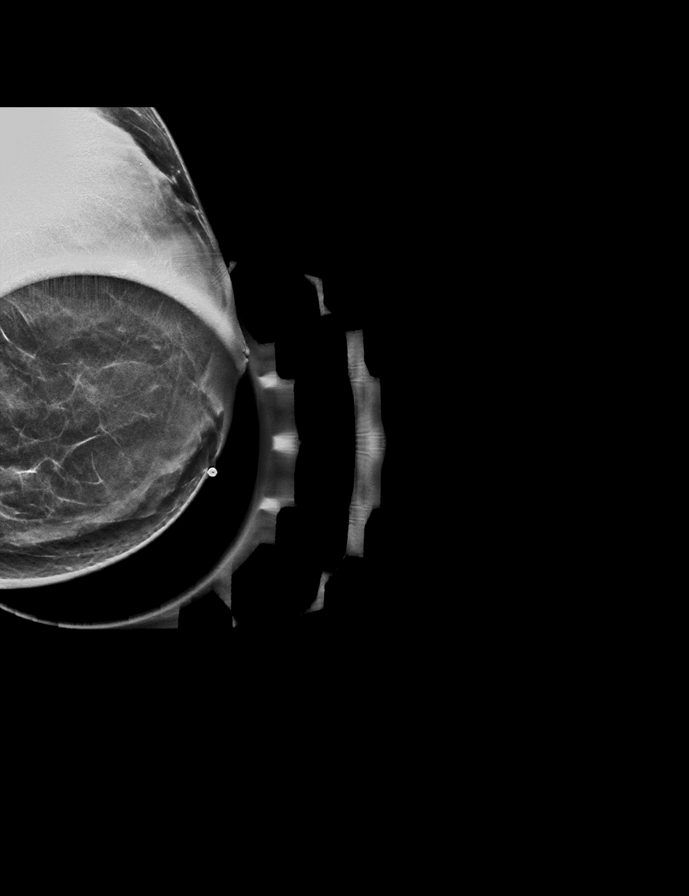

[R MLO synth-2D]
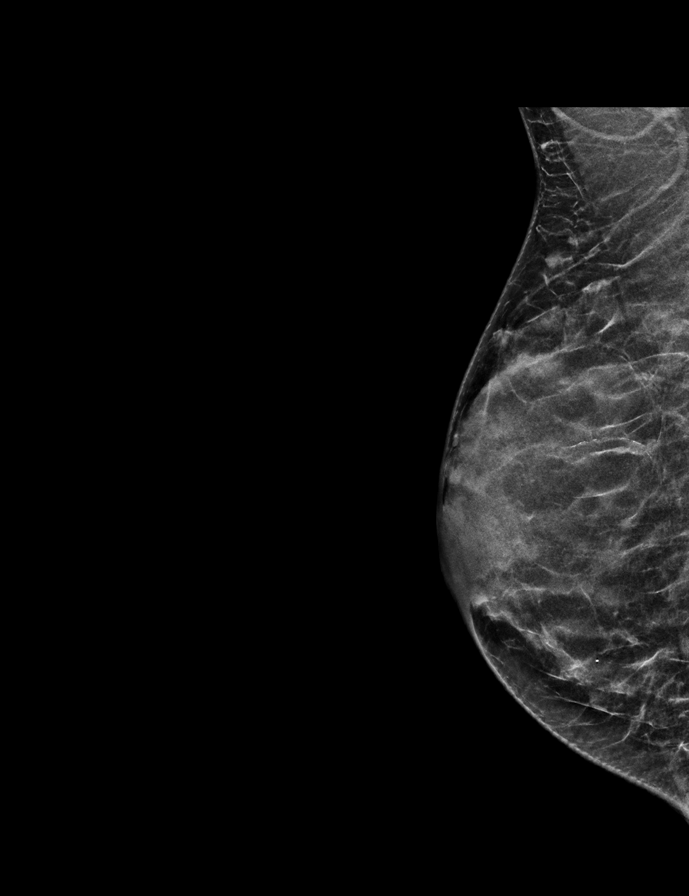

[R CC synth-2D]
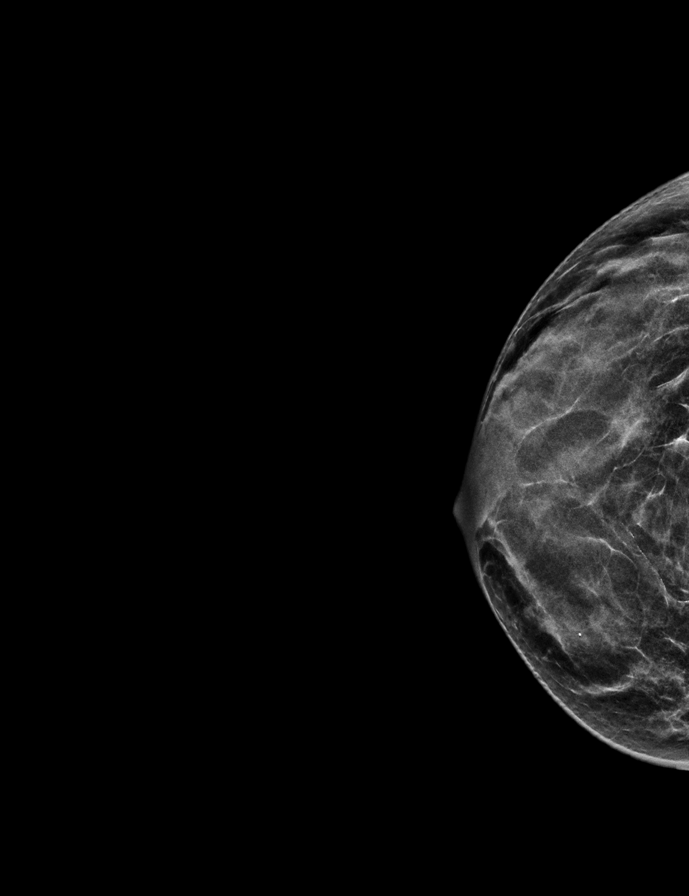

[L CC]
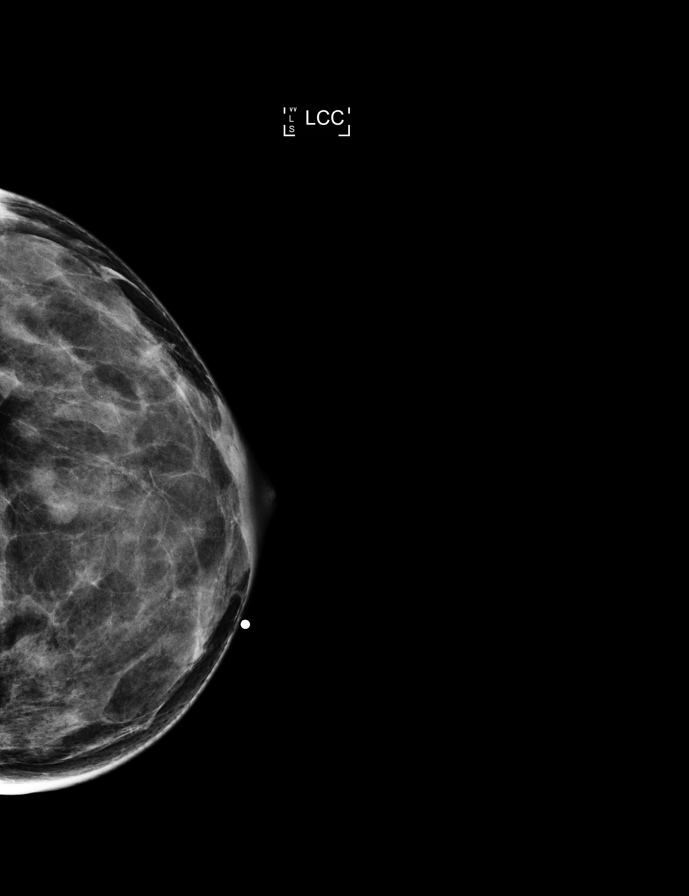

[L CC synth-2D]
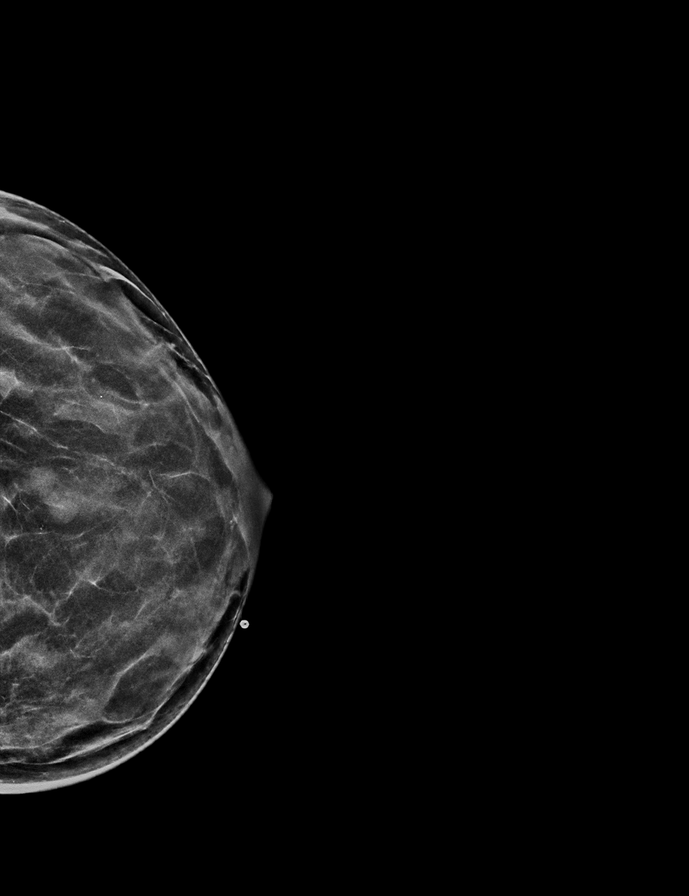

[R MLO]
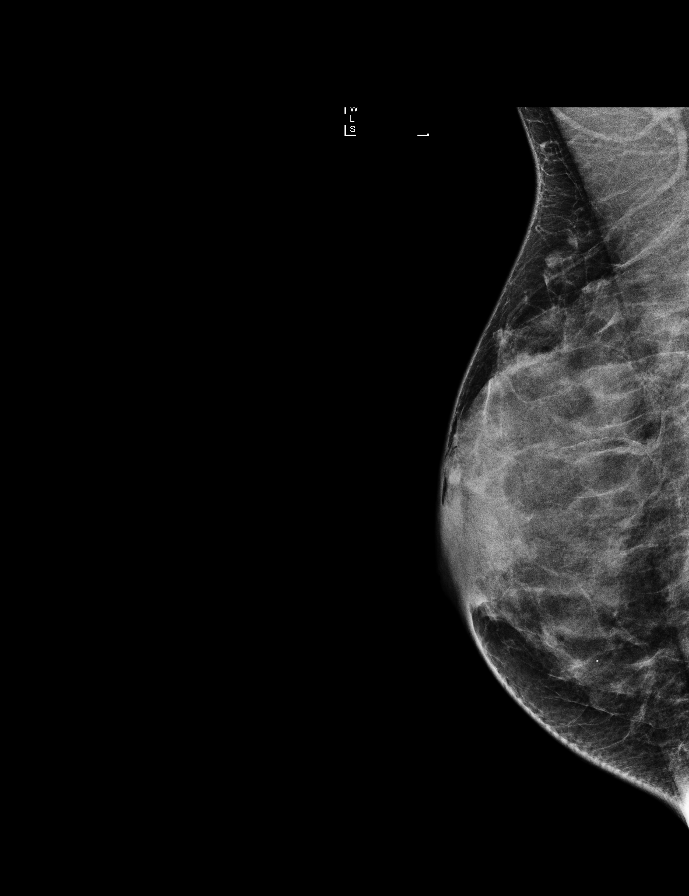

[8 of 35 positions shown; findings below may reference images not displayed]

ACR Breast Density Category d: The breast tissue is extremely dense,
which lowers the sensitivity of mammography.
FINDINGS: No suspicious mass, distortion, or microcalcifications are
identified to suggest presence of malignancy. Spot tangential view
in the area of patient's concern in the left breast is unremarkable.

Mammographic images were processed with CAD.

On physical exam, I palpate a rounded mobile mass in the upper inner
quadrant of the left breast near the nipple.

Targeted ultrasound is performed, showing 2 adjacent simple cysts in
the 10 o'clock location of the left breast 1 cm from the nipple
measuring 1.2 x 0.7 x 1.2 cm and 0.7 x 0.7 x 1.2 cm. No solid
component or areas of acoustic shadowing are identified.
IMPRESSION: 1.  No mammographic or ultrasound evidence for malignancy.
2. Palpable mass in the left breast represents adjacent simple
cysts.

RECOMMENDATION:
Screening mammogram in one year.(Code:E7-[id number])

I have discussed the findings and recommendations with the patient.
Results were also provided in writing at the conclusion of the
visit. If applicable, a reminder letter will be sent to the patient
regarding the next appointment.

BI-RADS CATEGORY  2: Benign.

## 2017-12-22 IMAGING — US ULTRASOUND LEFT BREAST LIMITED
1 series · 10 of 10 positions shown · non-contrast
Comparison: Prior studies including 10/13/2015 PE

CLINICAL DATA: The patient has a palpable mass in the left breast,
noted 1 month ago. History of left breast cyst aspiration and benign
biopsy of the right breast.

EXAM:
2D DIGITAL DIAGNOSTIC BILATERAL MAMMOGRAM WITH CAD AND ADJUNCT TOMO
ULTRASOUND LEFT BREAST

[Series 1: ultrasound left breast limited · 0.06mm/px · 10 of 10 slices shown]
[im 1/10]
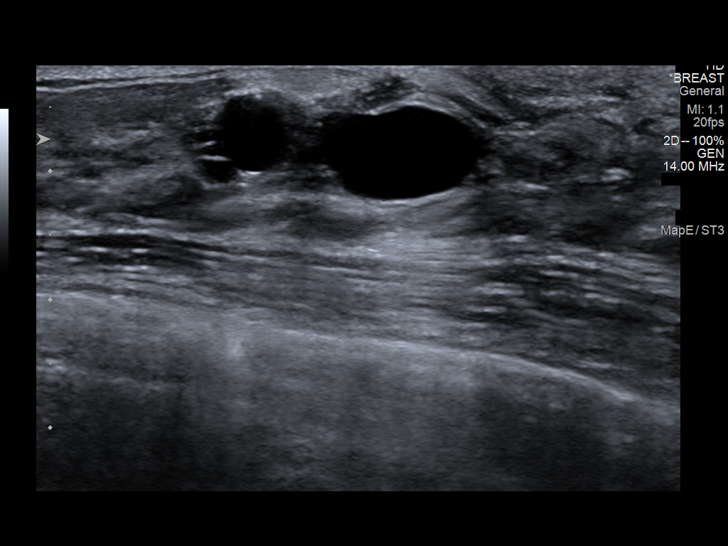
[im 2/10]
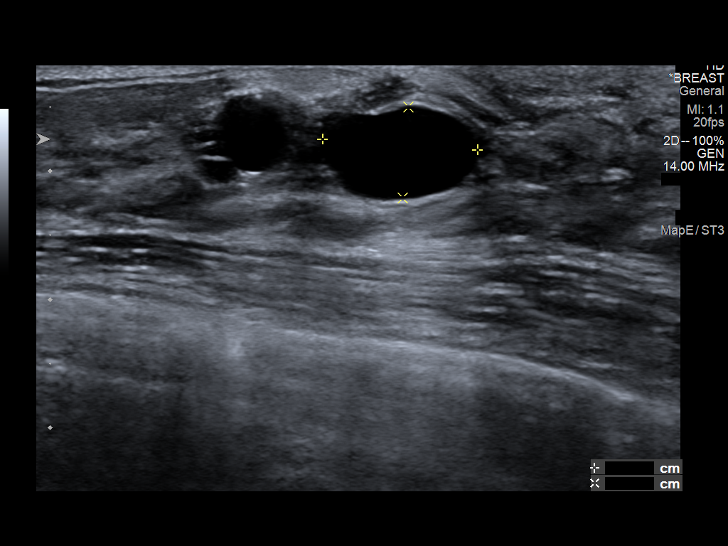
[im 3/10]
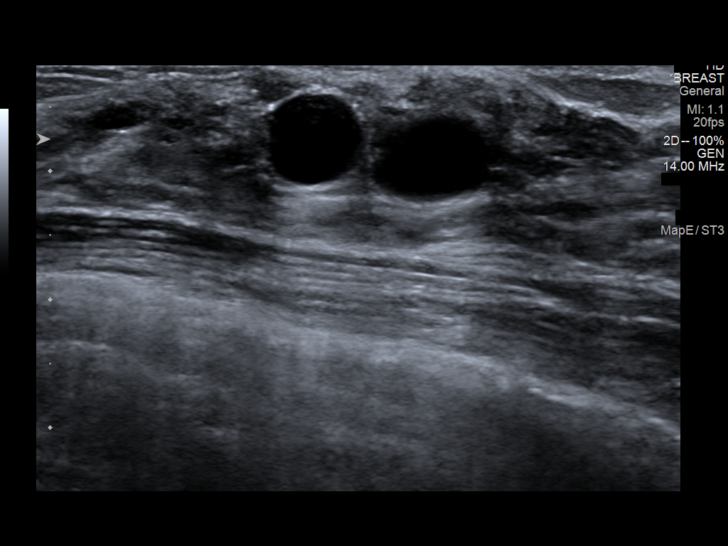
[im 4/10]
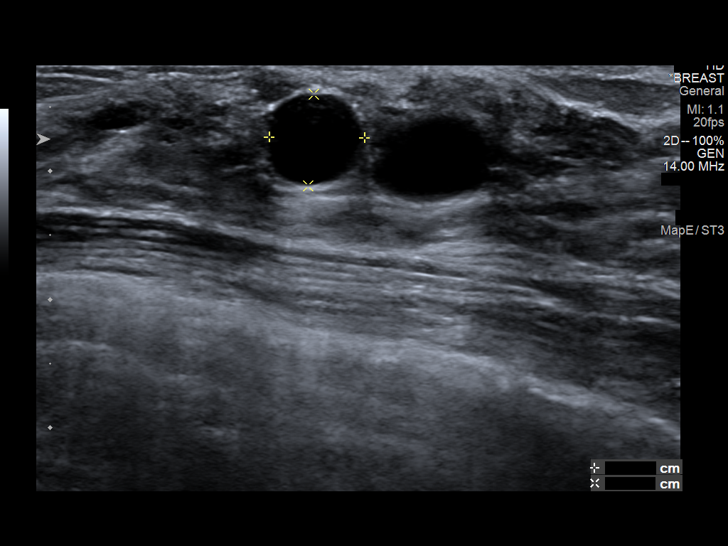
[im 5/10]
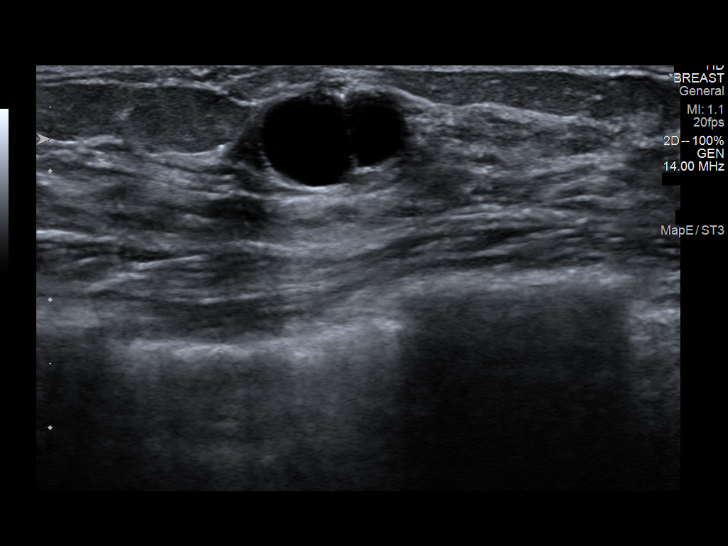
[im 6/10]
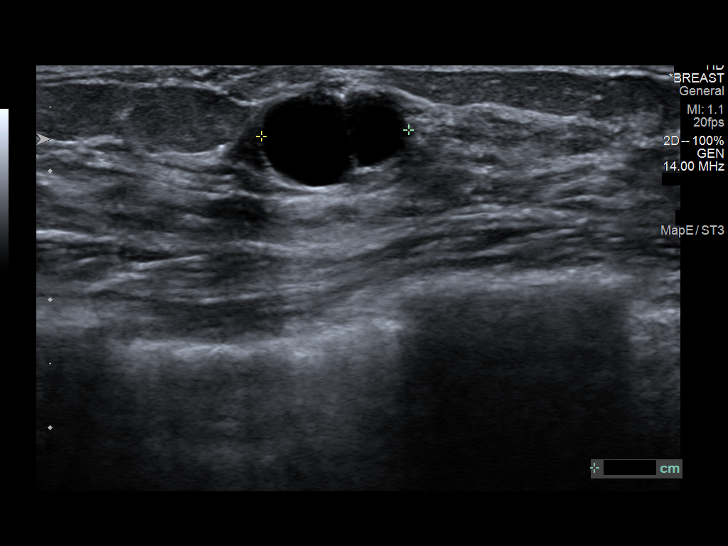
[im 7/10]
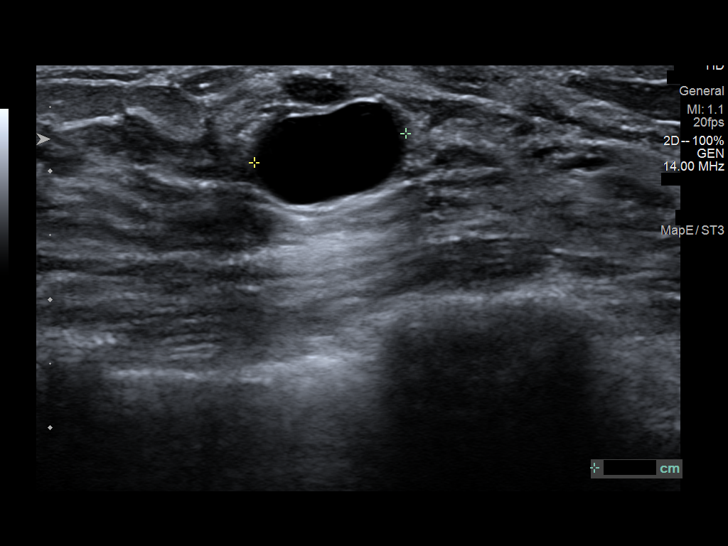
[im 8/10]
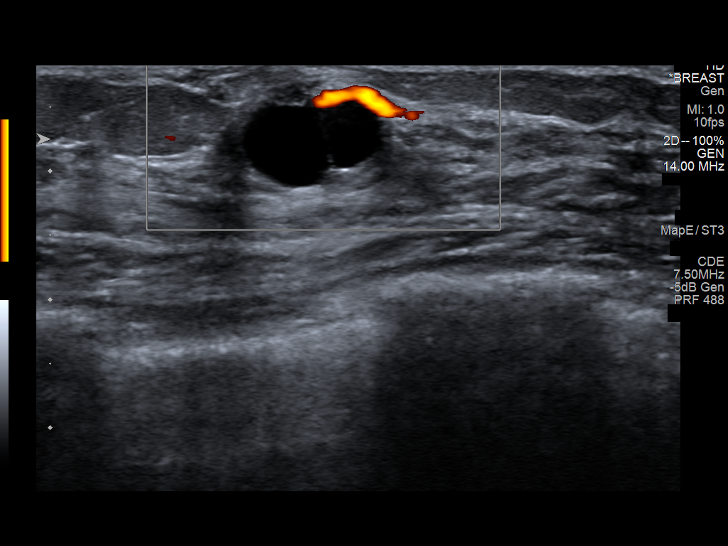
[im 9/10]
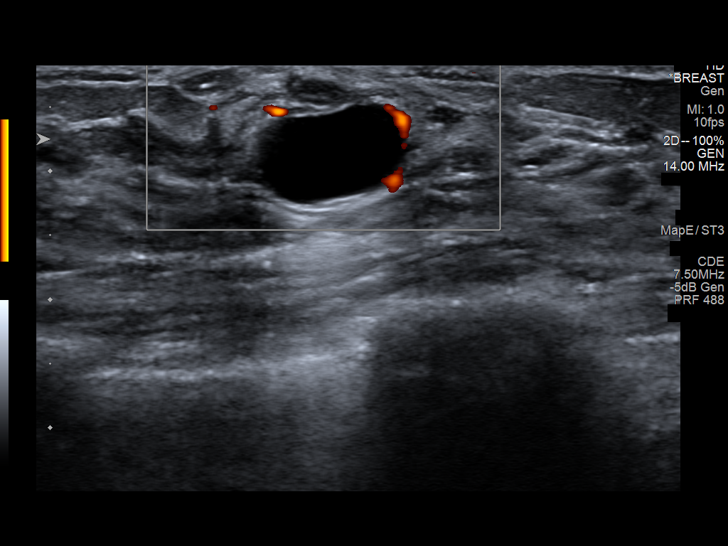
[im 10/10]
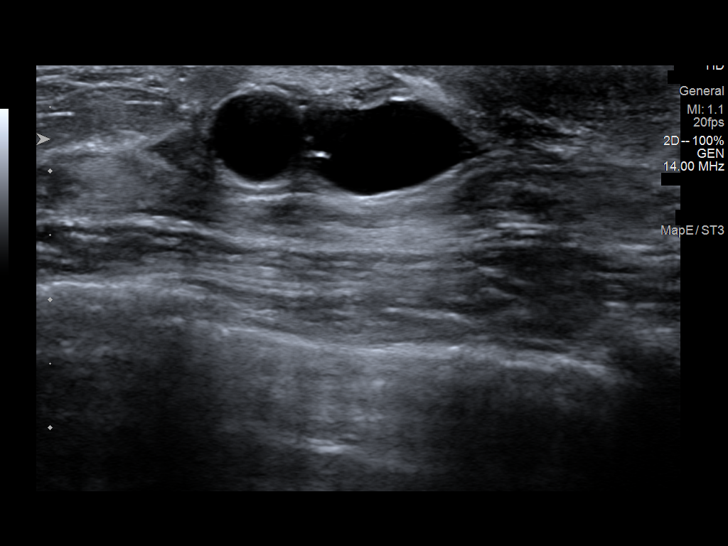

[10 of 10 positions shown; findings below may reference images not displayed]

ACR Breast Density Category d: The breast tissue is extremely dense,
which lowers the sensitivity of mammography.
FINDINGS: No suspicious mass, distortion, or microcalcifications are
identified to suggest presence of malignancy. Spot tangential view
in the area of patient's concern in the left breast is unremarkable.

Mammographic images were processed with CAD.

On physical exam, I palpate a rounded mobile mass in the upper inner
quadrant of the left breast near the nipple.

Targeted ultrasound is performed, showing 2 adjacent simple cysts in
the 10 o'clock location of the left breast 1 cm from the nipple
measuring 1.2 x 0.7 x 1.2 cm and 0.7 x 0.7 x 1.2 cm. No solid
component or areas of acoustic shadowing are identified.
IMPRESSION: 1.  No mammographic or ultrasound evidence for malignancy.
2. Palpable mass in the left breast represents adjacent simple
cysts.

RECOMMENDATION:
Screening mammogram in one year.(Code:[id number])

I have discussed the findings and recommendations with the patient.
Results were also provided in writing at the conclusion of the
visit. If applicable, a reminder letter will be sent to the patient
regarding the next appointment.

BI-RADS CATEGORY  2: Benign.

## 2017-12-27 ENCOUNTER — Encounter: Payer: 59 | Admitting: Primary Care

## 2018-01-17 ENCOUNTER — Encounter: Payer: Self-pay | Admitting: Primary Care

## 2018-01-17 ENCOUNTER — Ambulatory Visit (INDEPENDENT_AMBULATORY_CARE_PROVIDER_SITE_OTHER): Payer: 59 | Admitting: Primary Care

## 2018-01-17 ENCOUNTER — Other Ambulatory Visit (HOSPITAL_COMMUNITY)
Admission: RE | Admit: 2018-01-17 | Discharge: 2018-01-17 | Disposition: A | Discharge status: Home / Self Care | Payer: 59 | Source: Ambulatory Visit | Attending: Family Medicine | Admitting: Family Medicine

## 2018-01-17 VITALS — BP 100/62 | HR 62 | Temp 98.2°F | Ht 65.0 in | Wt 132.2 lb

## 2018-01-17 DIAGNOSIS — Z124 Encounter for screening for malignant neoplasm of cervix: Secondary | ICD-10-CM

## 2018-01-17 DIAGNOSIS — B001 Herpesviral vesicular dermatitis: Secondary | ICD-10-CM

## 2018-01-17 DIAGNOSIS — Z23 Encounter for immunization: Secondary | ICD-10-CM

## 2018-01-17 DIAGNOSIS — Z1231 Encounter for screening mammogram for malignant neoplasm of breast: Secondary | ICD-10-CM

## 2018-01-17 DIAGNOSIS — G250 Essential tremor: Secondary | ICD-10-CM | POA: Diagnosis not present

## 2018-01-17 DIAGNOSIS — Z1239 Encounter for other screening for malignant neoplasm of breast: Secondary | ICD-10-CM

## 2018-01-17 DIAGNOSIS — F3342 Major depressive disorder, recurrent, in full remission: Secondary | ICD-10-CM

## 2018-01-17 DIAGNOSIS — Z0000 Encounter for general adult medical examination without abnormal findings: Secondary | ICD-10-CM

## 2018-01-17 NOTE — Addendum Note (Signed)
Addended by: Jacqualin Combes on: 01/17/2018 04:40 PM   Modules accepted: Orders

## 2018-01-17 NOTE — Assessment & Plan Note (Signed)
Doing well on Lexapro 10 mg, continue same.

## 2018-01-17 NOTE — Assessment & Plan Note (Signed)
Immunizations UTD. Pap smear due, pending. Mammogram due next month, pending. Commended her on a healthy lifestyle.  Exam unremarkable. Labs pending. Follow up in 1 year.

## 2018-01-17 NOTE — Patient Instructions (Addendum)
Stop by the lab prior to leaving today. I will notify you of your results once received.   Continue exercising. You should be getting 150 minutes of moderate intensity exercise weekly.  Continue to focus on a healthy diet.  Call the breast center to schedule your mammogram.   We will be in touch once we receive your pap smear results.   Follow up in 1 year for your annual exam or sooner if needed.  It was a pleasure to see you today! Have a great time in [place]!

## 2018-01-17 NOTE — Assessment & Plan Note (Signed)
Mild and persistent to right hand. Taking Primidone intermittently.

## 2018-01-17 NOTE — Progress Notes (Signed)
Subjective:    Patient ID: Jessica Dodson, female    DOB: 08/04/1966, 52 y.o.   MRN: [id number]  HPI  Jessica Dodson is a 52 year old female who presents today for complete physical. She is due for Hep A booster, she traveled to [place] last Summer.   Immunizations: -Tetanus: Completed in 2018 -Influenza: Completed this season   Diet: She endorses a healthy diet. Breakfast: Oatmeal, blueberry bagel  Lunch: Left overs Dinner: Tofu, vegetables, lentils, fruit Snacks: Occasionally, pita chips Desserts: None  Beverages: Propel, water, coffee, occasional diet soda, green tea  Exercise: She is running 2 days weekly, 5-8 miles Eye exam: Completed 3 weeks ago Dental exam: Completed 1 month ago. Colonoscopy: Will be doing cologuard soon.  Pap Smear: Completed in 2016, due.  Mammogram: Completed in April 2018, due.   Review of Systems  Constitutional: Negative for unexpected weight change.  HENT: Negative for rhinorrhea.   Respiratory: Negative for cough and shortness of breath.   Cardiovascular: Negative for chest pain.  Gastrointestinal: Negative for constipation and diarrhea.  Genitourinary: Negative for difficulty urinating and menstrual problem.  Musculoskeletal: Negative for arthralgias and myalgias.  Skin: Negative for rash.  Allergic/Immunologic: Negative for environmental allergies.  Neurological: Negative for dizziness, numbness and headaches.  Psychiatric/Behavioral:       Doing well on Lexapro       Past Medical History:  Diagnosis Date  . ADHD (attention deficit hyperactivity disorder)   . Allergy   . Cold sore   . Complication of anesthesia    hard to wake up after tonsillectomy and nasoseptoplasty  . Depression    recurrent, not on meds at this time  . Depression   . H/O alcohol abuse    19 years sober  . Headache    migraine (once a year) usually has an aura  . Migraine with aura   . Pneumonia 2016     Social History   Socioeconomic History  . Marital  status: Married    Spouse name: Not on file  . Number of children: 0  . Years of education: MD  . Highest education level: Not on file  Social Needs  . Financial resource strain: Not on file  . Food insecurity - worry: Not on file  . Food insecurity - inability: Not on file  . Transportation needs - medical: Not on file  . Transportation needs - non-medical: Not on file  Occupational History  . Not on file  Tobacco Use  . Smoking status: Never Smoker  . Smokeless tobacco: Never Used  Substance and Sexual Activity  . Alcohol use: Yes    Alcohol/week: 0.0 oz    Comment: sober for 19 years  . Drug use: No  . Sexual activity: Not on file  Other Topics Concern  . Not on file  Social History Narrative   ** Merged History Encounter **       ** Data from: 05/12/15 Enc Dept: [facility]       ** Data from: 06/30/15 Enc Dept: [facility]   Married. 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  Live in [place].  Enjoys running, mountain bike, kayak.     Past Surgical History:  Procedure Laterality Date  . BREAST BIOPSY Right   . BREAST CYST ASPIRATION Left   . BREAST SURGERY Right    biopsy - benign  . COLONOSCOPY    . LIPOMA EXCISION Left    6 cm tumor  .  MASS EXCISION Left 05/08/2015   Procedure: EXCISION OF LEFT DELTOID MASS;  Surgeon: [name], MD;  Location: [facility];  Service: General;  Laterality: Left;  . NASAL SEPTUM SURGERY    . TONSILLECTOMY    . urethral stricture surgery     at age 79    Family History  Problem Relation Age of Onset  . Obesity Mother   . High Cholesterol Mother   . Arthritis Mother   . Hyperlipidemia Mother   . Hypertension Mother   . High Cholesterol Father   . Hypothyroidism Father   . Prostate cancer Father   . Hyperlipidemia Father   . Hypertension Father   . Prostate cancer Maternal Uncle   . Colon cancer Maternal Uncle   . Breast cancer Paternal Aunt   . Arthritis Maternal Grandmother   . Stroke  Maternal Grandmother   . Hypertension Maternal Grandmother   . Mental illness Maternal Grandmother   . Hypertension Maternal Grandfather   . Hyperlipidemia Paternal Grandmother   . Stroke Paternal Grandmother   . Prostate cancer Paternal Grandfather   . Hyperlipidemia Paternal Grandfather   . Heart disease Paternal Grandfather   . Hypertension Paternal Grandfather   . Prostate cancer Maternal Uncle   . Breast cancer Paternal Aunt   . Diabetes Paternal Uncle     No Known Allergies  Current Outpatient Medications on File Prior to Visit  Medication Sig Dispense Refill  . Cholecalciferol (VITAMIN D3) 5000 UNITS TABS Take 5,000 Units by mouth once a week.    . escitalopram (LEXAPRO) 10 MG tablet TAKE 1 TABLET BY MOUTH DAILY. 90 tablet 1  . valACYclovir (VALTREX) 1000 MG tablet TAKE 2 TABLETS BY MOUTH TWICE DAILY FOR 1 DAY AS NEEDED FOR BREAKOUTS. 30 tablet 3  . vitamin B-12 (CYANOCOBALAMIN) 500 MCG tablet Take 500 mcg by mouth daily. 1 Tablet Po 4-5 days/week    . aspirin 81 MG tablet Take 81 mg by mouth. Take 1 PO 4-5 days/week    . primidone (MYSOLINE) 50 MG tablet Take 1 tablet (50 mg total) by mouth 2 (two) times daily. (Patient not taking: Reported on 12/18/2017) 60 tablet 5   No current facility-administered medications on file prior to visit.     BP 100/62   Pulse 62   Temp 98.2 F (36.8 C) (Oral)   Ht 5\' 5"  (1.651 m)   Wt 132 lb 4 oz (60 kg)   LMP 12/25/2017   SpO2 98%   BMI 22.01 kg/m    Objective:   Physical Exam  Constitutional: She is oriented to person, place, and time. She appears well-nourished.  HENT:  Right Ear: Tympanic membrane and ear canal normal.  Left Ear: Tympanic membrane and ear canal normal.  Nose: Nose normal.  Mouth/Throat: Oropharynx is clear and moist.  Eyes: Conjunctivae and EOM are normal. Pupils are equal, round, and reactive to light.  Neck: Neck supple. No thyromegaly present.  Cardiovascular: Normal rate and regular rhythm.  No murmur  heard. Pulmonary/Chest: Effort normal and breath sounds normal. She has no rales.  Abdominal: Soft. Bowel sounds are normal. There is no tenderness.  Genitourinary: No breast swelling or tenderness. There is no tenderness or lesion on the right labia. There is no tenderness or lesion on the left labia. Cervix exhibits discharge. Cervix exhibits no motion tenderness. Right adnexum displays no tenderness. Left adnexum displays no tenderness. No erythema in the vagina. No vaginal discharge found.  Genitourinary Comments: Mild amount of thick, whitish discharge.  No foul smell.   Musculoskeletal: Normal range of motion.  Lymphadenopathy:    She has no cervical adenopathy.  Neurological: She is alert and oriented to person, place, and time. She has normal reflexes. No cranial nerve deficit.  Skin: Skin is warm and dry. No rash noted.  Psychiatric: She has a normal mood and affect.          Assessment & Plan:

## 2018-01-17 NOTE — Assessment & Plan Note (Signed)
No recent outbreaks. Continue PRN valacyclovir.

## 2018-01-18 LAB — LIPID PANEL
CHOL/HDL RATIO: 2
Cholesterol: 144 mg/dL (ref 0–200)
HDL: 58.2 mg/dL (ref >39.00)
LDL Cholesterol: 75 mg/dL (ref 0–99)
NONHDL: 86.1
Triglycerides: 54 mg/dL (ref 0.0–149.0)
VLDL: 10.8 mg/dL (ref 0.0–40.0)

## 2018-01-18 LAB — COMPREHENSIVE METABOLIC PANEL
ALT: 13 U/L (ref 0–35)
AST: 15 U/L (ref 0–37)
Albumin: 4.6 g/dL (ref 3.5–5.2)
Alkaline Phosphatase: 53 U/L (ref 39–117)
BILIRUBIN TOTAL: 0.9 mg/dL (ref 0.2–1.2)
BUN: 7 mg/dL (ref 6–23)
CO2: 29 meq/L (ref 19–32)
Calcium: 9.5 mg/dL (ref 8.4–10.5)
Chloride: 103 mEq/L (ref 96–112)
Creatinine, Ser: 0.55 mg/dL (ref 0.40–1.20)
GFR: 124.01 mL/min (ref >60.00)
GLUCOSE: 76 mg/dL (ref 70–99)
POTASSIUM: 3.9 meq/L (ref 3.5–5.1)
SODIUM: 139 meq/L (ref 135–145)
Total Protein: 7.1 g/dL (ref 6.0–8.3)

## 2018-01-18 LAB — CYTOLOGY - PAP: Diagnosis: NEGATIVE

## 2018-01-18 LAB — CBC
HCT: 38.8 % (ref 36.0–46.0)
Hemoglobin: 13.3 g/dL (ref 12.0–15.0)
MCHC: 34.4 g/dL (ref 30.0–36.0)
MCV: 95 fl (ref 78.0–100.0)
Platelets: 193 10*3/uL (ref 150.0–400.0)
RBC: 4.08 Mil/uL (ref 3.87–5.11)
RDW: 13.1 % (ref 11.5–15.5)
WBC: 5 10*3/uL (ref 4.0–10.5)

## 2018-01-18 LAB — TSH: TSH: 1.31 u[IU]/mL (ref 0.35–4.50)

## 2018-01-20 ENCOUNTER — Encounter: Payer: Self-pay | Admitting: Primary Care

## 2018-01-20 DIAGNOSIS — Z1211 Encounter for screening for malignant neoplasm of colon: Secondary | ICD-10-CM | POA: Diagnosis not present

## 2018-01-20 DIAGNOSIS — Z1212 Encounter for screening for malignant neoplasm of rectum: Secondary | ICD-10-CM | POA: Diagnosis not present

## 2018-01-22 LAB — COLOGUARD: Cologuard: NEGATIVE

## 2018-01-24 DIAGNOSIS — D485 Neoplasm of uncertain behavior of skin: Secondary | ICD-10-CM | POA: Diagnosis not present

## 2018-01-24 DIAGNOSIS — L820 Inflamed seborrheic keratosis: Secondary | ICD-10-CM | POA: Diagnosis not present

## 2018-01-24 DIAGNOSIS — Z808 Family history of malignant neoplasm of other organs or systems: Secondary | ICD-10-CM | POA: Diagnosis not present

## 2018-01-24 DIAGNOSIS — B009 Herpesviral infection, unspecified: Secondary | ICD-10-CM | POA: Diagnosis not present

## 2018-01-24 DIAGNOSIS — L821 Other seborrheic keratosis: Secondary | ICD-10-CM | POA: Diagnosis not present

## 2018-01-24 DIAGNOSIS — L432 Lichenoid drug reaction: Secondary | ICD-10-CM | POA: Diagnosis not present

## 2018-01-24 DIAGNOSIS — D225 Melanocytic nevi of trunk: Secondary | ICD-10-CM | POA: Diagnosis not present

## 2018-01-24 DIAGNOSIS — L578 Other skin changes due to chronic exposure to nonionizing radiation: Secondary | ICD-10-CM | POA: Diagnosis not present

## 2018-01-24 DIAGNOSIS — L570 Actinic keratosis: Secondary | ICD-10-CM | POA: Diagnosis not present

## 2018-01-24 DIAGNOSIS — Z1283 Encounter for screening for malignant neoplasm of skin: Secondary | ICD-10-CM | POA: Diagnosis not present

## 2018-01-24 DIAGNOSIS — L812 Freckles: Secondary | ICD-10-CM | POA: Diagnosis not present

## 2018-01-26 LAB — COLOGUARD

## 2018-01-29 ENCOUNTER — Encounter: Payer: Self-pay | Admitting: Primary Care

## 2018-02-20 DIAGNOSIS — L089 Local infection of the skin and subcutaneous tissue, unspecified: Secondary | ICD-10-CM | POA: Diagnosis not present

## 2018-02-20 DIAGNOSIS — D225 Melanocytic nevi of trunk: Secondary | ICD-10-CM | POA: Diagnosis not present

## 2018-02-27 DIAGNOSIS — D225 Melanocytic nevi of trunk: Secondary | ICD-10-CM | POA: Diagnosis not present

## 2018-02-28 ENCOUNTER — Ambulatory Visit
Admission: RE | Admit: 2018-02-28 | Discharge: 2018-02-28 | Disposition: A | Discharge status: Home / Self Care | Payer: 59 | Source: Ambulatory Visit | Attending: Primary Care | Admitting: Primary Care

## 2018-02-28 DIAGNOSIS — Z1239 Encounter for other screening for malignant neoplasm of breast: Secondary | ICD-10-CM

## 2018-02-28 DIAGNOSIS — Z1231 Encounter for screening mammogram for malignant neoplasm of breast: Secondary | ICD-10-CM | POA: Diagnosis not present

## 2018-06-08 DIAGNOSIS — S8002XA Contusion of left knee, initial encounter: Secondary | ICD-10-CM | POA: Diagnosis not present

## 2018-07-30 ENCOUNTER — Other Ambulatory Visit: Payer: Self-pay | Admitting: Primary Care

## 2018-07-30 DIAGNOSIS — F3342 Major depressive disorder, recurrent, in full remission: Secondary | ICD-10-CM

## 2018-09-03 ENCOUNTER — Emergency Department: Payer: 59

## 2018-09-03 ENCOUNTER — Observation Stay: Payer: 59

## 2018-09-03 ENCOUNTER — Other Ambulatory Visit: Payer: Self-pay

## 2018-09-03 ENCOUNTER — Observation Stay
Admission: EM | Admit: 2018-09-03 | Discharge: 2018-09-04 | Disposition: A | Discharge status: Home / Self Care | Payer: 59 | Attending: Internal Medicine | Admitting: Internal Medicine

## 2018-09-03 ENCOUNTER — Encounter: Payer: Self-pay | Admitting: Emergency Medicine

## 2018-09-03 DIAGNOSIS — I639 Cerebral infarction, unspecified: Secondary | ICD-10-CM

## 2018-09-03 DIAGNOSIS — Z7982 Long term (current) use of aspirin: Secondary | ICD-10-CM | POA: Insufficient documentation

## 2018-09-03 DIAGNOSIS — Z79899 Other long term (current) drug therapy: Secondary | ICD-10-CM | POA: Insufficient documentation

## 2018-09-03 DIAGNOSIS — R200 Anesthesia of skin: Secondary | ICD-10-CM | POA: Diagnosis not present

## 2018-09-03 DIAGNOSIS — F329 Major depressive disorder, single episode, unspecified: Secondary | ICD-10-CM | POA: Insufficient documentation

## 2018-09-03 DIAGNOSIS — G459 Transient cerebral ischemic attack, unspecified: Secondary | ICD-10-CM

## 2018-09-03 DIAGNOSIS — R001 Bradycardia, unspecified: Secondary | ICD-10-CM | POA: Diagnosis not present

## 2018-09-03 DIAGNOSIS — R29818 Other symptoms and signs involving the nervous system: Secondary | ICD-10-CM | POA: Diagnosis not present

## 2018-09-03 HISTORY — DX: Transient cerebral ischemic attack, unspecified: G45.9

## 2018-09-03 LAB — DIFFERENTIAL
Abs Immature Granulocytes: 0.01 10*3/uL (ref 0.00–0.07)
Basophils Absolute: 0.1 10*3/uL (ref 0.0–0.1)
Basophils Relative: 2 %
Eosinophils Absolute: 0.2 10*3/uL (ref 0.0–0.5)
Eosinophils Relative: 4 %
Immature Granulocytes: 0 %
LYMPHS ABS: 1.2 10*3/uL (ref 0.7–4.0)
LYMPHS PCT: 24 %
MONOS PCT: 9 %
Monocytes Absolute: 0.4 10*3/uL (ref 0.1–1.0)
NEUTROS ABS: 2.9 10*3/uL (ref 1.7–7.7)
Neutrophils Relative %: 61 %

## 2018-09-03 LAB — COMPREHENSIVE METABOLIC PANEL
ALK PHOS: 59 U/L (ref 38–126)
ALT: 16 U/L (ref 0–44)
AST: 24 U/L (ref 15–41)
Albumin: 4.4 g/dL (ref 3.5–5.0)
Anion gap: 9 (ref 5–15)
BILIRUBIN TOTAL: 0.8 mg/dL (ref 0.3–1.2)
BUN: 10 mg/dL (ref 6–20)
CALCIUM: 9.1 mg/dL (ref 8.9–10.3)
CHLORIDE: 104 mmol/L (ref 98–111)
CO2: 28 mmol/L (ref 22–32)
CREATININE: 0.63 mg/dL (ref 0.44–1.00)
GFR calc Af Amer: >60 mL/min (ref >60)
Glucose, Bld: 84 mg/dL (ref 70–99)
Potassium: 3.5 mmol/L (ref 3.5–5.1)
Sodium: 141 mmol/L (ref 135–145)
Total Protein: 7.7 g/dL (ref 6.5–8.1)

## 2018-09-03 LAB — PROTIME-INR
INR: 0.91
Prothrombin Time: 12.2 seconds (ref 11.4–15.2)

## 2018-09-03 LAB — CBC
HCT: 39.8 % (ref 36.0–46.0)
HEMOGLOBIN: 13.3 g/dL (ref 12.0–15.0)
MCH: 32.4 pg (ref 26.0–34.0)
MCHC: 33.4 g/dL (ref 30.0–36.0)
MCV: 96.8 fL (ref 80.0–100.0)
Platelets: 227 10*3/uL (ref 150–400)
RBC: 4.11 MIL/uL (ref 3.87–5.11)
RDW: 12 % (ref 11.5–15.5)
WBC: 4.8 10*3/uL (ref 4.0–10.5)
nRBC: 0 % (ref 0.0–0.2)

## 2018-09-03 LAB — APTT: APTT: 28 s (ref 24–36)

## 2018-09-03 LAB — POCT PREGNANCY, URINE: Preg Test, Ur: NEGATIVE

## 2018-09-03 LAB — TROPONIN I: Troponin I: <0.03 ng/mL (ref <0.03)

## 2018-09-03 LAB — GLUCOSE, CAPILLARY: Glucose-Capillary: 94 mg/dL (ref 70–99)

## 2018-09-03 MED ORDER — ESCITALOPRAM OXALATE 10 MG PO TABS
10.0000 mg | ORAL_TABLET | Freq: Every day | ORAL | Status: DC
  Administered 2018-09-04: 10 mg via ORAL
  Filled 2018-09-03: qty 1

## 2018-09-03 MED ORDER — SENNOSIDES-DOCUSATE SODIUM 8.6-50 MG PO TABS: 1.0000 | ORAL_TABLET | Freq: Every evening | ORAL | Status: DC | PRN

## 2018-09-03 MED ORDER — ATORVASTATIN CALCIUM 20 MG PO TABS
40.0000 mg | ORAL_TABLET | Freq: Every day | ORAL | Status: DC
  Administered 2018-09-03: 40 mg via ORAL
  Filled 2018-09-03: qty 2

## 2018-09-03 MED ORDER — VITAMIN B-12 100 MCG PO TABS
500.0000 ug | ORAL_TABLET | ORAL | Status: DC
  Filled 2018-09-03: qty 5

## 2018-09-03 MED ORDER — STROKE: EARLY STAGES OF RECOVERY BOOK
Freq: Once | Status: AC
  Administered 2018-09-03: 18:00:00

## 2018-09-03 MED ORDER — ASPIRIN 81 MG PO CHEW
324.0000 mg | CHEWABLE_TABLET | Freq: Once | ORAL | Status: DC
  Filled 2018-09-03: qty 4

## 2018-09-03 MED ORDER — ACETAMINOPHEN 650 MG RE SUPP: 650.0000 mg | RECTAL | Status: DC | PRN

## 2018-09-03 MED ORDER — ASPIRIN 81 MG PO CHEW
243.0000 mg | CHEWABLE_TABLET | Freq: Once | ORAL | Status: AC
  Administered 2018-09-03: 243 mg via ORAL

## 2018-09-03 MED ORDER — ACETAMINOPHEN 325 MG PO TABS: 650.0000 mg | ORAL_TABLET | ORAL | Status: DC | PRN

## 2018-09-03 MED ORDER — SODIUM CHLORIDE 0.9 % IV SOLN: INTRAVENOUS | Status: DC

## 2018-09-03 MED ORDER — ASPIRIN EC 325 MG PO TBEC
325.0000 mg | DELAYED_RELEASE_TABLET | Freq: Every day | ORAL | Status: DC
  Filled 2018-09-03: qty 1

## 2018-09-03 MED ORDER — ACETAMINOPHEN 160 MG/5ML PO SOLN
650.0000 mg | ORAL | Status: DC | PRN
  Filled 2018-09-03: qty 20.3

## 2018-09-03 MED ORDER — ENOXAPARIN SODIUM 40 MG/0.4ML ~~LOC~~ SOLN: 40.0000 mg | SUBCUTANEOUS | Status: DC

## 2018-09-03 MED ORDER — VITAMIN D 1000 UNITS PO TABS: 5000.0000 [IU] | ORAL_TABLET | ORAL | Status: DC

## 2018-09-03 NOTE — ED Notes (Signed)
CODE STROKE CALLED TO [phone] 

## 2018-09-03 NOTE — Progress Notes (Signed)
CODE STROKE- PHARMACY COMMUNICATION   Time CODE STROKE called/page received: [id number]  Time response to CODE STROKE was made (in person or via phone): [phone]  Time Stroke Kit retrieved from [facility] (only if needed):N/A - Patient declined TPA  Name of Provider/Nurse contacted: Jana Half   Past Medical History:  Diagnosis Date  . ADHD (attention deficit hyperactivity disorder)   . Allergy   . Cold sore   . Complication of anesthesia    hard to wake up after tonsillectomy and nasoseptoplasty  . Depression    recurrent, not on meds at this time  . Depression   . H/O alcohol abuse    19 years sober  . Headache    migraine (once a year) usually has an aura  . Migraine with aura   . Pneumonia 2016   Prior to Admission medications   Medication Sig Start Date End Date Taking? Authorizing Provider  aspirin 81 MG tablet Take 81 mg by mouth. Take 1 PO 4-5 days/week    [provider]  Cholecalciferol (VITAMIN D3) 5000 UNITS TABS Take 5,000 Units by mouth once a week.    [provider]  escitalopram (LEXAPRO) 10 MG tablet TAKE 1 TABLET BY MOUTH DAILY. 07/31/18   Pleas Koch, NP  primidone (MYSOLINE) 50 MG tablet Take 1 tablet (50 mg total) by mouth 2 (two) times daily. Patient not taking: Reported on 12/18/2017 12/15/16   Star Age, MD  valACYclovir (VALTREX) 1000 MG tablet TAKE 2 TABLETS BY MOUTH TWICE DAILY FOR 1 DAY AS NEEDED FOR BREAKOUTS. 10/03/17   Pleas Koch, NP  vitamin B-12 (CYANOCOBALAMIN) 500 MCG tablet Take 500 mcg by mouth daily. 1 Tablet Po 4-5 days/week    [provider]    Charlett Nose ,PharmD Clinical Pharmacist  09/03/2018  8:39 AM

## 2018-09-03 NOTE — ED Provider Notes (Signed)
[facility] Emergency Department Provider Note  Time seen: 8:58 AM  I have reviewed the triage vital signs and the nursing notes.   HISTORY  Chief Complaint Numbness    HPI Jessica Dodson is a 52 y.o. female with a past medical history of ADHD, depression, presents to the emergency department for left arm numbness.  According to the patient at 7:12 AM this morning patient developed acute onset of numbness of the left upper extremity.  Denies any other symptoms.  Denies any weakness, any confusion, slurred speech.  Patient has a history of 2 grandmothers who had strokes as well as a cousin who had a stroke at a younger age.  Review of systems largely negative.  Patient presents as a code stroke.   Past Medical History:  Diagnosis Date  . ADHD (attention deficit hyperactivity disorder)   . Allergy   . Cold sore   . Complication of anesthesia    hard to wake up after tonsillectomy and nasoseptoplasty  . Depression    recurrent, not on meds at this time  . Depression   . H/O alcohol abuse    19 years sober  . Headache    migraine (once a year) usually has an aura  . Migraine with aura   . Pneumonia 2016    Patient Active Problem List   Diagnosis Date Noted  . ADHD 12/07/2016  . Essential tremor 07/21/2016  . Herpes labialis 02/24/2016  . Preventative health care 10/20/2015  . Breast mass in female 10/12/2015  . Depression 06/30/2015    Past Surgical History:  Procedure Laterality Date  . BREAST BIOPSY Right   . BREAST CYST ASPIRATION Left   . BREAST SURGERY Right    biopsy - benign  . COLONOSCOPY    . LIPOMA EXCISION Left    6 cm tumor  . MASS EXCISION Left 05/08/2015   Procedure: EXCISION OF LEFT DELTOID MASS;  Surgeon: [name], MD;  Location: [facility];  Service: General;  Laterality: Left;  . NASAL SEPTUM SURGERY    . TONSILLECTOMY    . urethral stricture surgery     at age 38    Prior to Admission medications   Medication Sig  Start Date End Date Taking? Authorizing Provider  aspirin EC 81 MG tablet Take 81 mg by mouth daily.   Yes [provider]  Cholecalciferol (VITAMIN D3) 5000 UNITS TABS Take 5,000 Units by mouth once a week.   Yes [provider]  escitalopram (LEXAPRO) 10 MG tablet TAKE 1 TABLET BY MOUTH DAILY. Patient taking differently: Take 10 mg by mouth daily.  07/31/18  Yes Pleas Koch, NP  valACYclovir (VALTREX) 1000 MG tablet TAKE 2 TABLETS BY MOUTH TWICE DAILY FOR 1 DAY AS NEEDED FOR BREAKOUTS. Patient taking differently: Take 2,000 mg by mouth 2 (two) times daily as needed (for breakouts). Take 2 tablets by mouth twice daily for 1 day as needed for breakouts. 10/03/17  Yes Pleas Koch, NP  vitamin B-12 (CYANOCOBALAMIN) 500 MCG tablet Take 500 mcg by mouth once a week.    Yes [provider]  primidone (MYSOLINE) 50 MG tablet Take 1 tablet (50 mg total) by mouth 2 (two) times daily. Patient not taking: Reported on 12/18/2017 12/15/16   Star Age, MD    No Known Allergies  Family History  Problem Relation Age of Onset  . Obesity Mother   . High Cholesterol Mother   . Arthritis Mother   .  Hyperlipidemia Mother   . Hypertension Mother   . High Cholesterol Father   . Hypothyroidism Father   . Prostate cancer Father   . Hyperlipidemia Father   . Hypertension Father   . Prostate cancer Maternal Uncle   . Colon cancer Maternal Uncle   . Breast cancer Paternal Aunt   . Arthritis Maternal Grandmother   . Stroke Maternal Grandmother   . Hypertension Maternal Grandmother   . Mental illness Maternal Grandmother   . Hypertension Maternal Grandfather   . Hyperlipidemia Paternal Grandmother   . Stroke Paternal Grandmother   . Prostate cancer Paternal Grandfather   . Hyperlipidemia Paternal Grandfather   . Heart disease Paternal Grandfather   . Hypertension Paternal Grandfather   . Prostate cancer Maternal Uncle   . Breast cancer Paternal Aunt   . Diabetes  Paternal Uncle     Social History Social History   Tobacco Use  . Smoking status: Never Smoker  . Smokeless tobacco: Never Used  Substance Use Topics  . Alcohol use: Yes    Alcohol/week: 0.0 standard drinks    Comment: sober for 19 years  . Drug use: No    Review of Systems Constitutional: No loss of consciousness Eyes: Negative for visual complaints ENT: Negative for recent illness Cardiovascular: Negative for chest pain. Respiratory: Negative for shortness of breath. Gastrointestinal: Negative for abdominal pain Musculoskeletal: Negative for arm or leg weakness or pain. Skin: Negative for skin complaints  Neurological: Negative for headache.  Subjective left arm numbness.  No weakness, confusion or slurred speech. All other ROS negative  ____________________________________________   PHYSICAL EXAM:  VITAL SIGNS: ED Triage Vitals  Enc Vitals Group     BP 09/03/18 0802 (!) 158/77     Pulse Rate 09/03/18 0802 (!) 59     Resp 09/03/18 0802 16     Temp 09/03/18 0802 98.2 F (36.8 C)     Temp Source 09/03/18 0802 Oral     SpO2 09/03/18 0802 100 %     Weight 09/03/18 0803 132 lb (59.9 kg)     Height 09/03/18 0803 5\' 5"  (1.651 m)     Head Circumference --      Peak Flow --      Pain Score 09/03/18 0803 0     Pain Loc --      Pain Edu? --      Excl. in [place]? --    Constitutional: Alert and oriented. Well appearing and in no distress. Eyes: Normal exam ENT   Head: Normocephalic and atraumatic.   Mouth/Throat: Mucous membranes are moist. Cardiovascular: Normal rate, regular rhythm. No murmur Respiratory: Normal respiratory effort without tachypnea nor retractions. Breath sounds are clear  Gastrointestinal: Soft and nontender. No distention.   Musculoskeletal: Nontender with normal range of motion in all extremities.  Neurologic:  Normal speech and language.  Subjective numbness to left upper extremity, slight grip strength deficit in the left upper extremity.   No pronator drift.  Cranial nerves intact.  Lower extremities intact. Skin:  Skin is warm, dry and intact.  Psychiatric: Mood and affect are normal.   ____________________________________________    EKG  EKG reviewed and interpreted by myself shows a normal sinus rhythm at 66 bpm with a narrow QRS, normal axis, normal intervals, no concerning ST changes.  Reassuring EKG.  ____________________________________________    RADIOLOGY  CT scan of the head is negative.  Aspects of 10  ____________________________________________   INITIAL IMPRESSION / ASSESSMENT AND PLAN / ED COURSE  Pertinent labs & imaging results that were available during my care of the patient were reviewed by me and considered in my medical decision making (see chart for details).  Patient presents emergency department with acute onset of numbness of the left upper extremity starting at 7:12 AM.  Code stroke initiated as the patient is within the TPA window.  On my examination patient does have a slight grip deficit in the left upper extremity along with subjective numbness of the entire left upper extremity.  Exam otherwise intact.  Patient CT scan is negative, labs are largely within normal limits.  Neurology has seen the patient, patient has declined TPA at this time, neurology recommends inpatient work-up for CVA.  Differential this time would include CVA, paresthesia, peripheral nerve disorder, TIA.   NIH Stroke Scale   Interval: Baseline Time: 9:03 AM Person Administering Scale: Harvest Dark  Administer stroke scale items in the order listed. Record performance in each category after each subscale exam. Do not go back and change scores. Follow directions provided for each exam technique. Scores should reflect what the patient does, not what the clinician thinks the patient can do. The clinician should record answers while administering the exam and work quickly. Except where indicated, the patient should  not be coached (i.e., repeated requests to patient to make a special effort).   1a  Level of consciousness: 0=alert; keenly responsive  1b. LOC questions:  0=Performs both tasks correctly  1c. LOC commands: 0=Performs both tasks correctly  2.  Best Gaze: 0=normal  3.  Visual: 0=No visual loss  4. Facial Palsy: 0=Normal symmetric movement  5a.  Motor left arm: 0=No drift, limb holds 90 (or 45) degrees for full 10 seconds  5b.  Motor right arm: 0=No drift, limb holds 90 (or 45) degrees for full 10 seconds  6a. motor left leg: 0=No drift, limb holds 90 (or 45) degrees for full 10 seconds  6b  Motor right leg:  0=No drift, limb holds 90 (or 45) degrees for full 10 seconds  7. Limb Ataxia: 0=Absent  8.  Sensory: 1=Mild to moderate sensory loss; patient feels pinprick is less sharp or is dull on the affected side; there is a loss of superficial pain with pinprick but patient is aware She is being touched  9. Best Language:  0=No aphasia, normal  10. Dysarthria: 0=Normal  11. Extinction and Inattention: 0=No abnormality  12. Distal motor function: 0=Normal   Total:   1    ____________________________________________   FINAL CLINICAL IMPRESSION(S) / ED DIAGNOSES  CVA numbness left arm    Harvest Dark, MD 09/03/18 [phone]

## 2018-09-03 NOTE — ED Notes (Signed)
Dr. Paduchowski at bedside.  

## 2018-09-03 NOTE — ED Notes (Signed)
Patient transported to MRI 

## 2018-09-03 NOTE — ED Triage Notes (Signed)
Patient arrives by POV complaining of left arm numbness starting at 0712, "it feels a little heavy with pins and needles" extending into side of her neck.  Alert and oriented.  No neurological deficits noted.  Facial features symmetrical.  Denies any known injury.  Right handed.  Ran a half marathon on Sat.

## 2018-09-03 NOTE — ED Notes (Signed)
First Nurse Note:  Patient states she was driving to work this AM and had sudden onset of numbness in left arm at 0712.

## 2018-09-03 NOTE — Progress Notes (Signed)
MRI findings D/W patient over phone

## 2018-09-03 NOTE — ED Notes (Signed)
Neurologist per [facility] speaking with pt at this time.

## 2018-09-03 NOTE — H&P (Addendum)
[facility] [place] NAME: Jessica Dodson    MR#:  [id number]  DATE OF BIRTH:  1965-12-02  DATE OF ADMISSION:  09/03/2018  PRIMARY CARE PHYSICIAN: Pleas Koch, NP   REQUESTING/REFERRING PHYSICIAN: Harvest Dark, MD  CHIEF COMPLAINT:   Chief Complaint  Patient presents with  . Numbness   HISTORY OF PRESENT ILLNESS:  Jessica Dodson  is a 52 y.o. female with a known history of ADHD, depression, active runner being admitted under observation for possible TIA.  According to the patient at 7:12 AM this morning patient developed acute onset of numbness of the left upper extremity.  Patient has a history of 2 grandmothers who had strokes as well as a cousin who had a stroke at a younger age. Father had carotid endarterectomy for high grade stenosis. PAST MEDICAL HISTORY:   Past Medical History:  Diagnosis Date  . ADHD (attention deficit hyperactivity disorder)   . Allergy   . Cold sore   . Complication of anesthesia    hard to wake up after tonsillectomy and nasoseptoplasty  . Depression    recurrent, not on meds at this time  . Depression   . H/O alcohol abuse    19 years sober  . Headache    migraine (once a year) usually has an aura  . Migraine with aura   . Pneumonia 2016    PAST SURGICAL HISTORY:   Past Surgical History:  Procedure Laterality Date  . BREAST BIOPSY Right   . BREAST CYST ASPIRATION Left   . BREAST SURGERY Right    biopsy - benign  . COLONOSCOPY    . LIPOMA EXCISION Left    6 cm tumor  . MASS EXCISION Left 05/08/2015   Procedure: EXCISION OF LEFT DELTOID MASS;  Surgeon: [name], MD;  Location: [facility];  Service: General;  Laterality: Left;  . NASAL SEPTUM SURGERY    . TONSILLECTOMY    . urethral stricture surgery     at age 31    SOCIAL HISTORY:   Social History   Tobacco Use  . Smoking status: Never Smoker  . Smokeless tobacco: Never Used  Substance Use Topics  . Alcohol use: Yes   Alcohol/week: 0.0 standard drinks    Comment: sober for 19 years    FAMILY HISTORY:   Family History  Problem Relation Age of Onset  . Obesity Mother   . High Cholesterol Mother   . Arthritis Mother   . Hyperlipidemia Mother   . Hypertension Mother   . High Cholesterol Father   . Hypothyroidism Father   . Prostate cancer Father   . Hyperlipidemia Father   . Hypertension Father   . Prostate cancer Maternal Uncle   . Colon cancer Maternal Uncle   . Breast cancer Paternal Aunt   . Arthritis Maternal Grandmother   . Stroke Maternal Grandmother   . Hypertension Maternal Grandmother   . Mental illness Maternal Grandmother   . Hypertension Maternal Grandfather   . Hyperlipidemia Paternal Grandmother   . Stroke Paternal Grandmother   . Prostate cancer Paternal Grandfather   . Hyperlipidemia Paternal Grandfather   . Heart disease Paternal Grandfather   . Hypertension Paternal Grandfather   . Prostate cancer Maternal Uncle   . Breast cancer Paternal Aunt   . Diabetes Paternal Uncle     DRUG ALLERGIES:  No Known Allergies  REVIEW OF SYSTEMS:   Review of Systems  Constitutional: Negative for chills, fever  and weight loss.  HENT: Negative for nosebleeds and sore throat.   Eyes: Negative for blurred vision.  Respiratory: Negative for cough, shortness of breath and wheezing.   Cardiovascular: Negative for chest pain, orthopnea, leg swelling and PND.  Gastrointestinal: Negative for abdominal pain, constipation, diarrhea, heartburn, nausea and vomiting.  Genitourinary: Negative for dysuria and urgency.  Musculoskeletal: Negative for back pain.  Skin: Negative for rash.  Neurological: Positive for sensory change. Negative for dizziness, speech change, focal weakness and headaches.  Endo/Heme/Allergies: Does not bruise/bleed easily.  Psychiatric/Behavioral: Negative for depression.   MEDICATIONS AT HOME:   Prior to Admission medications   Medication Sig Start Date End Date  Taking? Authorizing Provider  aspirin EC 81 MG tablet Take 81 mg by mouth daily.   Yes [provider]  Cholecalciferol (VITAMIN D3) 5000 UNITS TABS Take 5,000 Units by mouth once a week.   Yes [provider]  escitalopram (LEXAPRO) 10 MG tablet TAKE 1 TABLET BY MOUTH DAILY. Patient taking differently: Take 10 mg by mouth daily.  07/31/18  Yes Pleas Koch, NP  valACYclovir (VALTREX) 1000 MG tablet TAKE 2 TABLETS BY MOUTH TWICE DAILY FOR 1 DAY AS NEEDED FOR BREAKOUTS. Patient taking differently: Take 2,000 mg by mouth 2 (two) times daily as needed (for breakouts). Take 2 tablets by mouth twice daily for 1 day as needed for breakouts. 10/03/17  Yes Pleas Koch, NP  vitamin B-12 (CYANOCOBALAMIN) 500 MCG tablet Take 500 mcg by mouth once a week.    Yes [provider]  primidone (MYSOLINE) 50 MG tablet Take 1 tablet (50 mg total) by mouth 2 (two) times daily. Patient not taking: Reported on 12/18/2017 12/15/16   Star Age, MD   VITAL SIGNS:  Blood pressure 140/78, pulse (!) 52, temperature 98.2 F (36.8 C), temperature source Oral, resp. rate 16, height 5\' 5"  (1.651 m), weight 59.9 kg, last menstrual period 09/01/2018, SpO2 100 %. PHYSICAL EXAMINATION:  Physical Exam  Constitutional: She is oriented to person, place, and time.  HENT:  Head: Normocephalic and atraumatic.  Eyes: Pupils are equal, round, and reactive to light. Conjunctivae and EOM are normal.  Neck: Normal range of motion. Neck supple. No tracheal deviation present. No thyromegaly present.  Cardiovascular: Normal rate, regular rhythm and normal heart sounds.  Pulmonary/Chest: Effort normal and breath sounds normal. No respiratory distress. She has no wheezes. She exhibits no tenderness.  Abdominal: Soft. Bowel sounds are normal. She exhibits no distension. There is no tenderness.  Musculoskeletal: Normal range of motion.  Neurological: She is alert and oriented to person, place, and time. A  sensory deficit is present. No cranial nerve deficit.  Skin: Skin is warm and dry. No rash noted.   LABORATORY PANEL:   CBC Recent Labs  Lab 09/03/18 0808  WBC 4.8  HGB 13.3  HCT 39.8  PLT 227   ------------------------------------------------------------------------------------------------------------------  Chemistries  Recent Labs  Lab 09/03/18 0808  NA 141  K 3.5  CL 104  CO2 28  GLUCOSE 84  BUN 10  CREATININE 0.63  CALCIUM 9.1  AST 24  ALT 16  ALKPHOS 59  BILITOT 0.8   ------------------------------------------------------------------------------------------------------------------  Cardiac Enzymes Recent Labs  Lab 09/03/18 0808  TROPONINI <0.03   ------------------------------------------------------------------------------------------------------------------  RADIOLOGY:  Ct Head Code Stroke Wo Contrast  Addendum Date: 09/03/2018   ADDENDUM REPORT: 09/03/2018 08:41 ADDENDUM: Study discussed by telephone with Dr. Lennette Bihari PADUCHOWSKI on 09/03/2018 at 0826 hours. Furthermore, sagittal reformatted brain images have now been provided.  My interpretation is unchanged. Electronically Signed   By: Genevie Ann M.D.   On: 09/03/2018 08:41   Result Date: 09/03/2018 CLINICAL DATA:  Code stroke. 52 year old female with left arm numbness for 1 hour. EXAM: CT HEAD WITHOUT CONTRAST TECHNIQUE: Contiguous axial images were obtained from the base of the skull through the vertex without intravenous contrast. COMPARISON:  None. FINDINGS: Brain: No sagittal reformatted images provided. Normal cerebral volume. No midline shift, ventriculomegaly, mass effect, evidence of mass lesion, intracranial hemorrhage or evidence of cortically based acute infarction. Gray-white matter differentiation is within normal limits throughout the brain. Vascular: No suspicious intracranial vascular hyperdensity. Mild Calcified atherosclerosis at the skull base. Skull: Negative. Sinuses/Orbits: Visualized  paranasal sinuses and mastoids are stable and well pneumatized. Other: Visualized orbits and scalp soft tissues are within normal limits. ASPECTS [facility] Stroke Program Early CT Score) - Ganglionic level infarction (caudate, lentiform nuclei, internal capsule, insula, M1-M3 cortex): 7 - Supraganglionic infarction (M4-M6 cortex): 3 Total score (0-10 with 10 being normal): 10 IMPRESSION: 1. Normal noncontrast CT appearance of the brain. 2. ASPECTS is 10. Electronically Signed: By: Genevie Ann M.D. On: 09/03/2018 08:22   IMPRESSION AND PLAN:  76 y f with lt arm numbness  * Suspected TIA - Tele, serial troponins - MRI brain, Neuro c/s - ASA, statin - echo, carotid dropplers - She refused tpa suggested by teleneuro  * Sinus Loletha Grayer - likely normal variant - she is marathon runner      All the records are reviewed and case discussed with ED provider. Management plans discussed with the patient, NURSING and they are in agreement.  CODE STATUS: FULL CODE  TOTAL TIME TAKING CARE OF THIS PATIENT: 45 minutes.    Max Sane M.D on 09/03/2018 at 9:52 AM  Between 7am to 6pm - Pager - [phone]  After 6pm go to [email or web address] - Proofreader  [facility]  Office  [phone]  CC: Primary care physician; Pleas Koch, NP   Note: This dictation was prepared with Dragon dictation along with smaller phrase technology. Any transcriptional errors that result from this process are unintentional.

## 2018-09-03 NOTE — Code Documentation (Signed)
Pt arrives via POV with complaints of sudden left arm numbness while driving at 5872, code stroke activated in triage, Dr. Jimmye Norman cleared pt for CT at 0810, after Ct pt taken to room 25, initial NIHSS 1 for left arm sensory deficit, tele-neuro evaluated pt and offered tPA, pt refused, pt states she takes a ASA daily, hx of depression, report off to [employer]

## 2018-09-03 NOTE — Consult Note (Signed)
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  Patient describes symptoms as feeling of heaviness with pins-and-needles sensation radiating to her left neck.  She denies other associated symptoms of dizziness, headache, speech, vision or gait disturbances, nausea or vomiting, cranial nerve deficit, focal motor weakness or other associated focal neurologic deficit. On arrival to the ED code stroke was initiated and patient evaluated by tele-neurologist.  Initial NIH stroke scale was 1 for left arm sensory deficit.  Initial CT head was negative with no acute hemorrhage or acute infarct noted.  Patient was not deemed candidate for TPA thrombolytics because of left arm numbness only, however TPA was offered which patient declined.  Due to family history of stroke patient was admitted for further stroke work-up and evaluation.  Date last known well: Date: 09/03/2018 Time last known well: Time: 07:12 tPA Given: No: Patient declined  Past Medical History:  Diagnosis Date  . ADHD (attention deficit hyperactivity disorder)   . Allergy   . Cold sore   . Complication of anesthesia    hard to wake up after tonsillectomy and nasoseptoplasty  . Depression    recurrent, not on meds at this time  . Depression   . H/O alcohol abuse    19 years sober  . Headache    migraine (once a year) usually has an aura  . Migraine with aura   . Pneumonia 2016    Past Surgical History:  Procedure Laterality Date  . BREAST BIOPSY Right   . BREAST CYST ASPIRATION Left   . BREAST SURGERY Right    biopsy - benign  . COLONOSCOPY    . LIPOMA EXCISION Left    6 cm tumor  . MASS EXCISION Left 05/08/2015   Procedure: EXCISION OF LEFT DELTOID MASS;   Surgeon: [name], MD;  Location: [place];  Service: General;  Laterality: Left;  . NASAL SEPTUM SURGERY    . TONSILLECTOMY    . urethral stricture surgery     at age 26    Family History  Problem Relation Age of Onset  . Obesity Mother   . High Cholesterol Mother   . Arthritis Mother   . Hyperlipidemia Mother   . Hypertension Mother   . High Cholesterol Father   . Hypothyroidism Father   . Prostate cancer Father   . Hyperlipidemia Father   . Hypertension Father   . Prostate cancer Maternal Uncle   . Colon cancer Maternal Uncle   . Breast cancer Paternal Aunt   . Arthritis Maternal Grandmother   . Stroke Maternal Grandmother   . Hypertension Maternal Grandmother   . Mental illness Maternal Grandmother   . Hypertension Maternal Grandfather   . Hyperlipidemia Paternal Grandmother   . Stroke Paternal Grandmother   . Prostate cancer Paternal Grandfather   . Hyperlipidemia Paternal Grandfather   . Heart disease Paternal Grandfather   . Hypertension Paternal Grandfather   . Prostate cancer Maternal Uncle   . Breast cancer Paternal Aunt   . Diabetes Paternal Uncle    Social History:  reports that she has never smoked. She has never used smokeless tobacco. She reports that she does not drink alcohol or use drugs.  Allergies: No Known Allergies  Medications:  I have reviewed the patient's current medications. Prior to Admission:  (Not in a hospital admission) Scheduled: . aspirin  325 mg Oral Daily  . atorvastatin  40 mg Oral q1800    ROS: History obtained from the patient   General ROS: negative for - chills, fatigue, fever, night sweats, weight gain or weight loss Psychological ROS: negative for - behavioral disorder, hallucinations, memory difficulties, mood swings or suicidal ideation Ophthalmic ROS: negative for - blurry vision, double vision, eye pain or loss of vision ENT ROS: negative for - epistaxis, nasal discharge, oral lesions, sore throat, tinnitus or  vertigo Allergy and Immunology ROS: negative for - hives or itchy/watery eyes Hematological and Lymphatic ROS: negative for - bleeding problems, bruising or swollen lymph nodes Endocrine ROS: negative for - galactorrhea, hair pattern changes, polydipsia/polyuria or temperature intolerance Respiratory ROS: negative for - cough, hemoptysis, shortness of breath or wheezing Cardiovascular ROS: negative for - chest pain, dyspnea on exertion, edema or irregular heartbeat Gastrointestinal ROS: negative for - abdominal pain, diarrhea, hematemesis, nausea/vomiting or stool incontinence Genito-Urinary ROS: negative for - dysuria, hematuria, incontinence or urinary frequency/urgency Musculoskeletal ROS: negative for - joint swelling or muscular weakness Neurological ROS: as noted in HPI Dermatological ROS: negative for rash and skin lesion changes  Physical Examination: Blood pressure 136/81, pulse (!) 51, temperature 98.2 F (36.8 C), temperature source Oral, resp. rate 18, height 5\' 5"  (1.651 m), weight 59.9 kg, last menstrual period 09/01/2018, SpO2 100 %.   HEENT-  Normocephalic, no lesions, without obvious abnormality.  Normal external eye and conjunctiva.  Normal TM's bilaterally.  Normal auditory canals and external ears. Normal external nose, mucus membranes and septum.  Normal pharynx. Cardiovascular- S1, S2 normal, pulses palpable throughout   Lungs- chest clear, no wheezing, rales, normal symmetric air entry Abdomen- soft, non-tender; bowel sounds normal; no masses,  no organomegaly Extremities- no edema Lymph-no adenopathy palpable Musculoskeletal-no joint tenderness, deformity or swelling Skin-warm and dry, no hyperpigmentation, vitiligo, or suspicious lesions  Neurological Exam   Mental Status: Alert, oriented, thought content appropriate.  Speech fluent without evidence of aphasia.  Able to follow 3 step commands without difficulty. Attention span and concentration seemed appropriate   Cranial Nerves: II: Discs flat bilaterally; Visual fields grossly normal, pupils equal, round, reactive to light and accommodation III,IV, VI: ptosis not present, extra-ocular motions intact bilaterally V,VII: smile symmetric, facial light touch sensation intact VIII: hearing normal bilaterally IX,X: gag reflex deferred XI: bilateral shoulder shrug XII: midline tongue extension Motor: Right :  Upper extremity   5/5 Without pronator drift      Left: Upper extremity   5/5 without pronator drift Right:   Lower extremity   5/5                                          Left: Lower extremity   5/5 Tone and bulk:normal tone throughout; no atrophy noted Sensory: Pinprick and light touch  decreased in the left hand Deep Tendon Reflexes: 2+ with absent left AJ Plantars: Right:  Mute                           Left: Mute Cerebellar: Finger-to-nose testing intact bilaterally. Heel to shin testing normal bilaterally Gait: not tested due to safety concerns  Data Reviewed  Laboratory Studies:  Basic Metabolic Panel: Recent Labs  Lab 09/03/18 0808  NA 141  K 3.5  CL 104  CO2 28  GLUCOSE 84  BUN 10  CREATININE 0.63  CALCIUM 9.1    Liver Function Tests: Recent Labs  Lab 09/03/18 0808  AST 24  ALT 16  ALKPHOS 59  BILITOT 0.8  PROT 7.7  ALBUMIN 4.4   No results for input(s): LIPASE, AMYLASE in the last 168 hours. No results for input(s): AMMONIA in the last 168 hours.  CBC: Recent Labs  Lab 09/03/18 0808  WBC 4.8  NEUTROABS 2.9  HGB 13.3  HCT 39.8  MCV 96.8  PLT 227    Cardiac Enzymes: Recent Labs  Lab 09/03/18 0808  TROPONINI <0.03    BNP: Invalid input(s): POCBNP  CBG: Recent Labs  Lab 09/03/18 0808  QIHKVQ 25    Microbiology: Results for orders placed or performed during the hospital encounter of 10/19/16  Rapid Influenza A&B Antigens ([place] only)     Status: None   Collection Time: 10/19/16  9:42 AM  Result Value Ref Range Status   Influenza A  ([place]) NEGATIVE NEGATIVE Final   Influenza B ([facility]) NEGATIVE NEGATIVE Final    Coagulation Studies: Recent Labs    09/03/18 0808  LABPROT 12.2  INR 0.91    Urinalysis: No results for input(s): COLORURINE, LABSPEC, PHURINE, GLUCOSEU, HGBUR, BILIRUBINUR, KETONESUR, PROTEINUR, UROBILINOGEN, NITRITE, LEUKOCYTESUR in the last 168 hours.  Invalid input(s): APPERANCEUR  Lipid Panel:    Component Value Date/Time   CHOL 144 01/17/2018 1544   TRIG 54.0 01/17/2018 1544   HDL 58.20 01/17/2018 1544   CHOLHDL 2 01/17/2018 1544   VLDL 10.8 01/17/2018 1544   LDLCALC 75 01/17/2018 1544    HgbA1C: No results found for: HGBA1C  Urine Drug Screen:  No results found for: LABOPIA, COCAINSCRNUR, LABBENZ, AMPHETMU, THCU, LABBARB  Alcohol Level: No results for input(s): ETH in the last 168 hours.  Other results: EKG: normal EKG, normal sinus rhythm, unchanged from previous tracings. Vent. rate 66 BPM PR interval * ms QRS duration 97 ms QT/QTc 432/453 ms P-R-T axes 77 86 56  Imaging: Mr Brain Wo Contrast  Result Date: 09/03/2018 CLINICAL DATA:  Left arm numbness EXAM: MRI HEAD WITHOUT CONTRAST TECHNIQUE: Multiplanar, multiecho pulse sequences of the brain and surrounding structures were obtained without intravenous contrast. COMPARISON:  Head CT earlier today FINDINGS: Brain: No acute infarction, hemorrhage, hydrocephalus, extra-axial collection or mass lesion. 2 tiny FLAIR hyperintense foci in the cerebral white matter from nonspecific remote insult, usually considered age congruent. Vascular: Major flow voids are preserved Skull and upper cervical spine: Negative Sinuses/Orbits: Negative IMPRESSION: Negative exam. No infarct or other explanation for left arm symptoms. Electronically Signed   By: Monte Fantasia M.D.   On: 09/03/2018 11:46   Ct Head Code Stroke Wo Contrast  Addendum Date: 09/03/2018   ADDENDUM REPORT: 09/03/2018 08:41 ADDENDUM: Study discussed by telephone with Dr. Lennette Bihari  PADUCHOWSKI on 09/03/2018 at 0826 hours. Furthermore, sagittal reformatted brain images have now been provided. My interpretation is unchanged. Electronically Signed   By: Genevie Ann M.D.   On: 09/03/2018 08:41   Result Date: 09/03/2018 CLINICAL DATA:  Code stroke. 52 year old female with left arm numbness for 1 hour. EXAM: CT HEAD WITHOUT CONTRAST TECHNIQUE: Contiguous axial images were obtained from the base of the skull through the vertex without intravenous contrast. COMPARISON:  None. FINDINGS: Brain: No sagittal reformatted images provided. Normal cerebral volume. No midline shift, ventriculomegaly, mass effect, evidence of mass lesion, intracranial hemorrhage or  evidence of cortically based acute infarction. Gray-white matter differentiation is within normal limits throughout the brain. Vascular: No suspicious intracranial vascular hyperdensity. Mild Calcified atherosclerosis at the skull base. Skull: Negative. Sinuses/Orbits: Visualized paranasal sinuses and mastoids are stable and well pneumatized. Other: Visualized orbits and scalp soft tissues are within normal limits. ASPECTS [facility] Stroke Program Early CT Score) - Ganglionic level infarction (caudate, lentiform nuclei, internal capsule, insula, M1-M3 cortex): 7 - Supraganglionic infarction (M4-M6 cortex): 3 Total score (0-10 with 10 being normal): 10 IMPRESSION: 1. Normal noncontrast CT appearance of the brain. 2. ASPECTS is 10. Electronically Signed: By: Genevie Ann M.D. On: 09/03/2018 08:22    Patient seen and examined.  Clinical course and management discussed.  Necessary edits performed.  I agree with the above.  Assessment and plan of care developed and discussed below.     Assessment: 52 y.o. female with pertinent history of ADHD, alcohol abuse, depression, and migraine headache with aura presenting to the ED with chief complaint of sudden left arm numbness while driving today at around 07:12 am.  Likely TIA.  Initial CT head negative for  acute intracranial abnormality.  MRI brain reviewed and shows no acute intracranial abnormality.  Patient states that she was on aspirin 81 mg prior to this event.  Although there is concern for an MR negative event due to LUE isolation of symptoms will rule out a cervical etiology as well.    Stroke Risk Factors - family history  Plan: 1. HgbA1c, fasting lipid panel 2. PT consult, OT consult, Speech consult 3. Echocardiogram with bubble study 4. Carotid dopplers 5. MRI of the cervical spine 6. Prophylactic therapy- Plavix 75 mg /day  7. D/C ASA 8. NPO until RN stroke swallow screen 9. Telemetry monitoring 10. Frequent neuro checks  This patient was staffed with Dr. Magda Paganini, Doy Mince who personally evaluated patient, reviewed documentation and agreed with assessment and plan of care as above.  Rufina Falco, DNP, FNP-BC Board certified Nurse Practitioner Neurology Department  09/03/2018, 1:41 PM  Alexis Goodell, MD Neurology [phone]  09/03/2018  5:02 PM

## 2018-09-03 NOTE — ED Notes (Signed)
Pt ambulatory to restroom at this time with steady gait noted.

## 2018-09-03 NOTE — Consult Note (Signed)
   [facility] Tele[facility]urology Consult Services    Date of Service:   09/03/2018 08:22:00  Impression:      .  RO Acute Ischemic Stroke     .  Small Vessel Infarct     .  Right Hemispheric  Comments: Permissive HTN and IV fluids. ASA now and stroke work up  Mechanism of Stroke: Possible Thromboembolic Possible Cardioembolic Small Vessel Disease  Metrics: Last Known Well: 09/03/2018 07:12:00 [facility] Notification Time: 09/03/2018 08:22:00 Arrival Time: 09/03/2018 07:51:00 Stamp Time: 09/03/2018 08:22:00 Time First Login Attempt: 09/03/2018 08:31:00 Video Start Time: 09/03/2018 08:31:00  Symptoms: left arm numbness NIHSS Start Assessment Time: 09/03/2018 08:32:00 Patient is not a candidate for tPA. Patient was not deemed candidate for tPA thrombolytics because of Left arm numbness only, discussed tpa with patient and she declined. . Video End Time: 09/03/2018 08:40:00  CT head showed no acute hemorrhage or acute core infarct. CT head was reviewed.  Advanced imaging was not obtained as the presentation was not suggestive of Large Vessel Occlusive Disease.  ER physician notified of the decision on thrombolytics management.  Our recommendations are outlined below.  Recommendations:     .  Antiplatelet Therapy Recommended  Routine Consultation with [facility] for Follow up Care  Sign Out:     .  Discussed with Emergency Department Provider    ------------------------------------------------------------------------------  History of Present Illness:  Patient is a 52 years old Female.  Patient was brought by private transportation with symptoms of left arm numbness  Patient presented with left arm numbness started when she was driving to work today at 712. She said its the whole arm, no other numbness in face or leg. No speech issues and no headache.  CT head showed no acute hemorrhage or acute core infarct. CT head was reviewed.     Examination:  1A: Level of Consciousness - Alert; keenly responsive + 0 1B: Ask Month and Age - Both Questions Right + 0 1C: Blink Eyes & Squeeze Hands - Performs Both Tasks + 0 2: Test Horizontal Extraocular Movements - Normal + 0 3: Test Visual Fields - No Visual Loss + 0 4: Test Facial Palsy (Use Grimace if Obtunded) - Normal symmetry + 0 5A: Test Left Arm Motor Drift - No Drift for 10 Seconds + 0 5B: Test Right Arm Motor Drift - No Drift for 10 Seconds + 0 6A: Test Left Leg Motor Drift - No Drift for 5 Seconds + 0 6B: Test Right Leg Motor Drift - No Drift for 5 Seconds + 0 7: Test Limb Ataxia (FNF/Heel-Shin) - No Ataxia + 0 8: Test Sensation - Mild-Moderate Loss: Less Sharp/More Dull + 1 9: Test Language/Aphasia - Normal; No aphasia + 0 10: Test Dysarthria - Normal + 0 11: Test Extinction/Inattention - No abnormality + 0  NIHSS Score: 1  Patient was informed the Neurology Consult would happen via TeleHealth consult by way of interactive audio and video telecommunications and consented to receiving care in this manner.  Due to the immediate potential for life-threatening deterioration due to underlying acute neurologic illness, I spent 35 minutes providing critical care. This time includes time for face to face visit via telemedicine, review of medical records, imaging studies and discussion of findings with providers, the patient and/or family.   Dr Lemar Livings   [facility] [phone]

## 2018-09-03 NOTE — Plan of Care (Signed)
Pt admitted from the ED.  VSS. Denies pain. NIH 1.  LUE numbness per pt.

## 2018-09-03 NOTE — Progress Notes (Signed)
Chaplain responded to an OR for question regarding AD. Pt has an AD and wanted to change phone number. Chaplain let her know she didn't need to get it notarized for change. She asked for a mental health HCPOA. Chaplain will research.    09/03/18 1300  Clinical Encounter Type  Visited With Patient  Visit Type Follow-up  Referral From [place]

## 2018-09-03 NOTE — Progress Notes (Signed)
   09/03/18 6503  Clinical Encounter Type  Visited With Patient  Visit Type Initial  Referral From Nurse  Consult/Referral To Chaplain  Spiritual Encounters  Spiritual Needs Emotional  CH arrived at room and pt was receiving care from ED team and [facility]. Patient was able to verbalize needs and answer questions clearly. [facility] engaged patient briefly. 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 Pastoral care provided included building rapport, validation of emotions, and being a non-anxious presence. No follow up visits needed.

## 2018-09-04 ENCOUNTER — Observation Stay: Payer: 59

## 2018-09-04 ENCOUNTER — Observation Stay (HOSPITAL_BASED_OUTPATIENT_CLINIC_OR_DEPARTMENT_OTHER)
Admit: 2018-09-04 | Discharge: 2018-09-04 | Disposition: A | Discharge status: Home / Self Care | Payer: 59 | Attending: Internal Medicine | Admitting: Internal Medicine

## 2018-09-04 DIAGNOSIS — I6522 Occlusion and stenosis of left carotid artery: Secondary | ICD-10-CM | POA: Diagnosis not present

## 2018-09-04 DIAGNOSIS — Z79899 Other long term (current) drug therapy: Secondary | ICD-10-CM | POA: Diagnosis not present

## 2018-09-04 DIAGNOSIS — G459 Transient cerebral ischemic attack, unspecified: Secondary | ICD-10-CM | POA: Diagnosis not present

## 2018-09-04 DIAGNOSIS — R200 Anesthesia of skin: Secondary | ICD-10-CM | POA: Diagnosis not present

## 2018-09-04 DIAGNOSIS — R001 Bradycardia, unspecified: Secondary | ICD-10-CM | POA: Diagnosis not present

## 2018-09-04 DIAGNOSIS — Z7982 Long term (current) use of aspirin: Secondary | ICD-10-CM | POA: Diagnosis not present

## 2018-09-04 DIAGNOSIS — F329 Major depressive disorder, single episode, unspecified: Secondary | ICD-10-CM | POA: Diagnosis not present

## 2018-09-04 LAB — LIPID PANEL
Cholesterol: 177 mg/dL (ref 0–200)
HDL: 59 mg/dL (ref >40)
LDL CALC: 108 mg/dL — AB (ref 0–99)
Total CHOL/HDL Ratio: 3 RATIO
Triglycerides: 52 mg/dL (ref <150)
VLDL: 10 mg/dL (ref 0–40)

## 2018-09-04 LAB — ECHOCARDIOGRAM COMPLETE
Height: 65 in
WEIGHTICAEL: 2169.6 [oz_av]

## 2018-09-04 LAB — HEMOGLOBIN A1C
HEMOGLOBIN A1C: 5 % (ref 4.8–5.6)
Mean Plasma Glucose: 96.8 mg/dL

## 2018-09-04 LAB — THYROID PANEL
Free Thyroxine Index: 2.1 (ref 1.2–4.9)
T3 Uptake Ratio: 25 % (ref 24–39)
T4 TOTAL: 8.5 ug/dL (ref 4.5–12.0)

## 2018-09-04 MED ORDER — ATORVASTATIN CALCIUM 40 MG PO TABS: 40.0000 mg | ORAL_TABLET | Freq: Every day | ORAL | 1 refills | Status: DC

## 2018-09-04 MED ORDER — CLOPIDOGREL BISULFATE 75 MG PO TABS: 75.0000 mg | ORAL_TABLET | Freq: Every day | ORAL | 11 refills | Status: DC

## 2018-09-04 NOTE — Progress Notes (Signed)
PT Cancellation Note  Patient Details Name: Jessica Dodson MRN: [id number] DOB: Aug 28, 1966   Cancelled Treatment:    Reason Eval/Treat Not Completed: Patient at procedure or test/unavailable.  Order received.  Chart reviewed.  Pt currently out of room.  Will re-attempt later when time allows.   Roxanne Gates, PT, DPT 09/04/2018, 9:23 AM

## 2018-09-04 NOTE — Progress Notes (Signed)
SLP Cancellation Note  Patient Details Name: Jessica Dodson MRN: [id number] DOB: 1966/08/30   Cancelled treatment:       Reason Eval/Treat Not Completed: SLP screened, no needs identified, will sign off(chart reviewed; consulted NSG then met w/ pt in room). Pt denied any difficulty swallowing and is currently on a regular diet; tolerates swallowing pills w/ water per NSG. Pt conversed at conversational level w/out deficits noted; pt conversed w/ staff during her Echocardiogram and denied any speech-language deficits.  No further skilled ST services indicated as pt appears at her baseline. Pt agreed. NSG to reconsult if any change in status.     Orinda Kenner, MS, CCC-SLP Oma Alpert 09/04/2018, 9:45 AM

## 2018-09-04 NOTE — Progress Notes (Signed)
PT Cancellation Note  Patient Details Name: Jessica Dodson MRN: [id number] DOB: 05-15-1966   Cancelled Treatment:    Reason Eval/Treat Not Completed: PT screened, no needs identified, will sign off.  Pt sitting up with OT upon PT arrival.  She has been walking "around the nurses station" and reports no strength deficits, only numbness distributed evenly throughout L UE.  Will complete order at this time.  Please consult later if a need arises.   Roxanne Gates, PT, DPT 09/04/2018, 11:14 AM

## 2018-09-04 NOTE — Progress Notes (Signed)
Patient discharged home per MD order. All discharge instructions given and all questions answered. 

## 2018-09-04 NOTE — Progress Notes (Signed)
Subjective: Patient remains stable with no new strokelike symptoms reported.  Anticipating DC today.    Objective: Current vital signs: BP 131/77 (BP Location: Left Arm)   Pulse (!) 51   Temp (!) 97.5 F (36.4 C) (Oral)   Resp 16   Ht 5\' 5"  (1.651 m)   Wt 61.5 kg   LMP 09/01/2018   SpO2 100%   BMI 22.57 kg/m  Vital signs in last 24 hours: Temp:  [97.5 F (36.4 C)-98.1 F (36.7 C)] 97.5 F (36.4 C) (10/29 1208) Pulse Rate:  [45-56] 51 (10/29 1208) Resp:  [16-18] 16 (10/29 1208) BP: (103-142)/(68-99) 131/77 (10/29 1208) SpO2:  [96 %-100 %] 100 % (10/29 1208) Weight:  [61.5 kg] 61.5 kg (10/28 1721)  Intake/Output from previous day: No intake/output data recorded. Intake/Output this shift: No intake/output data recorded. Nutritional status:  Diet Order            Diet - low sodium heart healthy        Diet vegetarian Room service appropriate? Yes; Fluid consistency: Thin  Diet effective now              Neurologic Exam:  Mental Status: Alert, oriented, thought content appropriate. Speech fluent without evidence of aphasia. Able to follow 3 step commands without difficulty. Attention span and concentration seemed appropriate  Cranial Nerves: II: Discs flat bilaterally; Visual fields grossly normal, pupils equal, round, reactive to light and accommodation III,IV, VI: ptosis not present, extra-ocular motions intact bilaterally V,VII: smile symmetric, facial light touch sensationintact VIII: hearing normal bilaterally IX,X: gag reflex deferred XI: Dodson shoulder shrug XII: midline tongue extension Motor: Right :Upper extremity 5/5Without pronator driftLeft: Upper extremity 5/5 without pronator drift Right:Lower extremity 5/5Left: Lower extremity 5/5 Tone and bulk:normal tone throughout; no atrophy noted Sensory: Pinprick and light touch decreased in the left hand Deep Tendon Reflexes: 2+ with  absent left AJ Plantars: Right: MuteLeft: Mute Cerebellar: Finger-to-nosetesting intact bilaterally.Heel to shin testing normal bilaterally Gait: not tested due to safety concerns  Data Reviewed Lab Results: Basic Metabolic Panel: Recent Labs  Lab 09/03/18 0808  NA 141  K 3.5  CL 104  CO2 28  GLUCOSE 84  BUN 10  CREATININE 0.63  CALCIUM 9.1    Liver Function Tests: Recent Labs  Lab 09/03/18 0808  AST 24  ALT 16  ALKPHOS 59  BILITOT 0.8  PROT 7.7  ALBUMIN 4.4   No results for input(s): LIPASE, AMYLASE in the last 168 hours. No results for input(s): AMMONIA in the last 168 hours.  CBC: Recent Labs  Lab 09/03/18 0808  WBC 4.8  NEUTROABS 2.9  HGB 13.3  HCT 39.8  MCV 96.8  PLT 227    Cardiac Enzymes: Recent Labs  Lab 09/03/18 0808  TROPONINI <0.03    Lipid Panel: Recent Labs  Lab 09/04/18 0618  CHOL 177  TRIG 52  HDL 59  CHOLHDL 3.0  VLDL 10  LDLCALC 108*    CBG: Recent Labs  Lab 09/03/18 0808  GLUCAP 66    Microbiology: Results for orders placed or performed during the hospital encounter of 10/19/16  Rapid Influenza A&B Antigens ([place] only)     Status: None   Collection Time: 10/19/16  9:42 AM  Result Value Ref Range Status   Influenza A [facility]) NEGATIVE NEGATIVE Final   Influenza B [facility]) NEGATIVE NEGATIVE Final    Coagulation Studies: Recent Labs    09/03/18 0808  LABPROT 12.2  INR 0.91    Imaging: Dg  Chest 2 View  Result Date: 09/03/2018 CLINICAL DATA:  Transient ischemic attack. EXAM: CHEST - 2 VIEW COMPARISON:  Radiographs of October 19, 2016. FINDINGS: The heart size and mediastinal contours are within normal limits. Both lungs are clear. The visualized skeletal structures are unremarkable. IMPRESSION: No active cardiopulmonary disease. Electronically Signed   By: Marijo Conception, M.D.   On: 09/03/2018 18:27   Mr Brain Wo Contrast  Result Date: 09/03/2018 CLINICAL DATA:  Left arm  numbness EXAM: MRI HEAD WITHOUT CONTRAST TECHNIQUE: Multiplanar, multiecho pulse sequences of the brain and surrounding structures were obtained without intravenous contrast. COMPARISON:  Head CT earlier today FINDINGS: Brain: No acute infarction, hemorrhage, hydrocephalus, extra-axial collection or mass lesion. 2 tiny FLAIR hyperintense foci in the cerebral white matter from nonspecific remote insult, usually considered age congruent. Vascular: Major flow voids are preserved Skull and upper cervical spine: Negative Sinuses/Orbits: Negative IMPRESSION: Negative exam. No infarct or other explanation for left arm symptoms. Electronically Signed   By: Monte Fantasia M.D.   On: 09/03/2018 11:46   Mr Cervical Spine Wo Contrast  Result Date: 09/03/2018 CLINICAL DATA:  Left arm numbness since this morning. EXAM: MRI CERVICAL SPINE WITHOUT CONTRAST TECHNIQUE: Multiplanar, multisequence MR imaging of the cervical spine was performed. No intravenous contrast was administered. COMPARISON:  None. FINDINGS: Alignment: Normal. Mild straightening of the normal cervical lordosis. Vertebrae: C7 hemangiomas are noted. No worrisome bone lesions and no fracture. Normal marrow signal. Cord: Normal cord signal intensity.  No cord lesions or syrinx. Posterior Fossa, vertebral arteries, paraspinal tissues: No significant findings. Mild to moderate C1-2 degenerative changes are noted. Disc levels: C2-3: No significant findings. C3-4: Very shallow central disc protrusion with mild impression on the ventral thecal sac and slight narrowing of the ventral CSF space. No foraminal stenosis. C4-5: No significant findings. C5-6: Mild diffuse annular bulge with slight flattening of the ventral thecal sac and slight narrowing the ventral CSF space. No significant spinal or foraminal stenosis. C6-7: No significant findings. C7-T1: Mild uncinate spurring changes with mild foraminal encroachment on the left. No significant stenosis. IMPRESSION: 1.  No significant disc protrusions, spinal or foraminal stenosis. 2. Mild left foraminal encroachment at C7-T1 but no stenosis. 3. Normal appearance of the cervical spinal cord. Electronically Signed   By: Marijo Sanes M.D.   On: 09/03/2018 16:57   Jessica Dodson (at [facility])  Result Date: 09/04/2018 CLINICAL DATA:  TIA. EXAM: Dodson CAROTID DUPLEX ULTRASOUND TECHNIQUE: Pearline Cables scale imaging, color Doppler and duplex ultrasound were performed of Dodson carotid and vertebral arteries in the neck. COMPARISON:  None. FINDINGS: Criteria: Quantification of carotid stenosis is based on velocity parameters that correlate the residual internal carotid diameter with NASCET-based stenosis levels, using the diameter of the distal internal carotid lumen as the denominator for stenosis measurement. The following velocity measurements were obtained: RIGHT ICA:  98/34 cm/sec CCA:  32/44 cm/sec SYSTOLIC ICA/CCA RATIO:  1.1 ECA:  98 cm/sec LEFT ICA:  75/37 cm/sec CCA:  01/02 cm/sec SYSTOLIC ICA/CCA RATIO:  1.0 ECA:  74 cm/sec RIGHT CAROTID ARTERY: There is no grayscale evidence of significant intimal thickening or atherosclerotic plaque affecting the interrogated portions of the right carotid system. There are no elevated peak systolic velocities within the interrogated course of the right internal carotid artery to suggest a hemodynamically significant stenosis. RIGHT VERTEBRAL ARTERY:  Antegrade flow LEFT CAROTID ARTERY: There is a minimal amount of echogenic plaque involving the origin and proximal aspect of the left internal  carotid artery (image 54), not resulting in elevated peak systolic velocities within the interrogated course of the left internal carotid artery to suggest a hemodynamically significant stenosis. LEFT VERTEBRAL ARTERY:  Antegrade Flow IMPRESSION: 1. Minimal amount of left-sided atherosclerotic plaque, not resulting in a hemodynamically significant stenosis. 2. Normal sonographic  evaluation of the right carotid system. Electronically Signed   By: Sandi Mariscal M.D.   On: 09/04/2018 09:57   Ct Head Code Stroke Wo Contrast  Addendum Date: 09/03/2018   ADDENDUM REPORT: 09/03/2018 08:41 ADDENDUM: Study discussed by telephone with Dr. Lennette Bihari PADUCHOWSKI on 09/03/2018 at 0826 hours. Furthermore, sagittal reformatted brain images have now been provided. My interpretation is unchanged. Electronically Signed   By: Genevie Ann M.D.   On: 09/03/2018 08:41   Result Date: 09/03/2018 CLINICAL DATA:  Code stroke. 52 year old female with left arm numbness for 1 hour. EXAM: CT HEAD WITHOUT CONTRAST TECHNIQUE: Contiguous axial images were obtained from the base of the skull through the vertex without intravenous contrast. COMPARISON:  None. FINDINGS: Brain: No sagittal reformatted images provided. Normal cerebral volume. No midline shift, ventriculomegaly, mass effect, evidence of mass lesion, intracranial hemorrhage or evidence of cortically based acute infarction. Gray-white matter differentiation is within normal limits throughout the brain. Vascular: No suspicious intracranial vascular hyperdensity. Mild Calcified atherosclerosis at the skull base. Skull: Negative. Sinuses/Orbits: Visualized paranasal sinuses and mastoids are stable and well pneumatized. Other: Visualized orbits and scalp soft tissues are within normal limits. ASPECTS [facility] Stroke Program Early CT Score) - Ganglionic level infarction (caudate, lentiform nuclei, internal capsule, insula, M1-M3 cortex): 7 - Supraganglionic infarction (M4-M6 cortex): 3 Total score (0-10 with 10 being normal): 10 IMPRESSION: 1. Normal noncontrast CT appearance of the brain. 2. ASPECTS is 10. Electronically Signed: By: Genevie Ann M.D. On: 09/03/2018 08:22    Medications:  I have reviewed the patient's current medications. Prior to Admission:  Medications Prior to Admission  Medication Sig Dispense Refill Last Dose  . aspirin EC 81 MG tablet Take 81 mg  by mouth daily.   09/03/2018 at 0630  . Cholecalciferol (VITAMIN D3) 5000 UNITS TABS Take 5,000 Units by mouth once a week.   Past Month at Unknown time  . escitalopram (LEXAPRO) 10 MG tablet TAKE 1 TABLET BY MOUTH DAILY. (Patient taking differently: Take 10 mg by mouth daily. ) 90 tablet 1 09/03/2018 at am  . valACYclovir (VALTREX) 1000 MG tablet TAKE 2 TABLETS BY MOUTH TWICE DAILY FOR 1 DAY AS NEEDED FOR BREAKOUTS. (Patient taking differently: Take 2,000 mg by mouth 2 (two) times daily as needed (for breakouts). Take 2 tablets by mouth twice daily for 1 day as needed for breakouts.) 30 tablet 3 unknown at unknown  . vitamin B-12 (CYANOCOBALAMIN) 500 MCG tablet Take 500 mcg by mouth once a week.    Past Month at Unknown time  . primidone (MYSOLINE) 50 MG tablet Take 1 tablet (50 mg total) by mouth 2 (two) times daily. (Patient not taking: Reported on 12/18/2017) 60 tablet 5 Not Taking at Unknown time   Scheduled: . aspirin EC  325 mg Oral Daily  . atorvastatin  40 mg Oral q1800  . cholecalciferol  5,000 Units Oral Weekly  . enoxaparin (LOVENOX) injection  40 mg Subcutaneous Q24H  . escitalopram  10 mg Oral Daily  . vitamin B-12  500 mcg Oral Weekly    Patient seen and examined.  Clinical course and management discussed.  Necessary edits performed.  I agree with the above.  Assessment  and plan of care developed and discussed below.    Assessment: 52 y.o. female presenting with sudden left arm numbness without other associated symptoms. Continued left arm symptoms.  MRI cervical spine performed to rule out cervical etiology, reviewed and did not show significant disc protrusion, spinal or foraminal stenosis.  Mild left foraminal encroachment at C7-T1 noted with no stenosis.  Echocardiogram pending.  Jessica carotids bilaterally showed minimal left sided atherosclerotic plate without hemodynamically significant stenosis.  Hemoglobin A1c 5.0, LDL 108.  Plan: 1. Switch Aspirin to Plavix 75 mg /day  2.  Start statin with goal low density lipoprotein (LDL) <70 mg/dl 3. If echocardiogram unremarkable patient to follow up with neurology on an outpatient basis.  No further inpatient evaluation recommended at this time.     This patient was staffed with Dr. Magda Paganini, Doy Mince who personally evaluated patient, reviewed documentation and agreed with assessment and plan of care as above.  Rufina Falco, DNP, FNP-BC Board certified Nurse Practitioner Neurology Department   LOS: 0 days   09/04/2018  2:03 PM  Alexis Goodell, MD Neurology [phone]  09/04/2018  2:39 PM

## 2018-09-04 NOTE — Evaluation (Signed)
Occupational Therapy Evaluation Patient Details Name: Jessica Dodson MRN: [id number] DOB: 1966/09/13 Today's Date: 09/04/2018    History of Present Illness Jessica Dodson  is a 52 y.o. female with a known history of ADHD, depression, active runner being admitted under observation for possible TIA.  According to the patient at 7:12 AM this morning patient developed acute onset of numbness of the left upper extremity.   Clinical Impression   Jessica Dodson  is a 52 y.o. female with a known history of ADHD, depression, active runner being admitted under observation for possible TIA.  According to the patient at 7:12 AM this morning patient developed acute onset of numbness of the left upper extremity.  CT and MRI were negative and MRI of spine indicating mild left foraminal encroachment at C7-T1 but no stenosis. Bilateral carotid US was also normal sonographic evaluation of the right carotid system and minimal amount of left-sided atherosclerotic plaque, not resulting in a hemodynamically significant stenosis.  She has decreased sensation to light touch throughout the left side of her neck to LUE and hand both dorsal and ventral.  Fine motor and gross motor skills are intact. Per Dr Doy Mince notes, there is also no pronator drift and finger to nose test was normal bilaterally.  She is able to complete ADLs with extra time due to having to look at her L hand when completing bilateral tasks but able to independently complete.  She presents with tight shoulder muscles, especially upper trapezius, deltoid and levator scapulae bilaterally with a lot of trigger points and pain on R side but not able to sense any pain on L side with trigger point releases.  No increase in sensation during or after session.  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  She would benefit from outpatient PT for dry needling to help release bilateral shoulder muscles and continue with trigger point release, stretches, possibly TENS and then outpatient OT if needed for hand therapy at [facility]).       Follow Up Recommendations  Outpatient OT    Equipment Recommendations       Recommendations for Other Services       Precautions / Restrictions Precautions Precautions: None Restrictions Weight Bearing Restrictions: No      Mobility Bed Mobility                  Transfers                      Balance                                           ADL either performed or assessed with clinical judgement   ADL Overall ADL's : At baseline                                       General ADL Comments: Pt at risk for injury to LUE and hand due to decreased sensation.  Discussed how to use RUE and hand to test temperature of water in sink, tub or shower,  etc.  Also discussed edema management in case she starts to have edema but none present currently.  She is able to complete all ADLs and using her vision to compensate for decreased sensation in L hand.  No deficits in ADLs and she is at baseline with extra time only needed to compensate.     Vision Baseline Vision/History: No visual deficits Patient Visual Report: No change from baseline       Perception     Praxis      Pertinent Vitals/Pain Pain Assessment: No/denies pain     Hand Dominance Right   Extremity/Trunk Assessment Upper Extremity Assessment Upper Extremity Assessment: LUE deficits/detail LUE Deficits / Details: Full AROM with intact gross and fine motor skills in L hand but has decreased sensation to light touch throughout LUE and hand LUE Sensation: decreased light touch   Lower Extremity Assessment Lower Extremity Assessment: Overall WFL for tasks assessed   Cervical / Trunk  Assessment Cervical / Trunk Assessment: Normal   Communication Communication Communication: No difficulties   Cognition Arousal/Alertness: Awake/alert Behavior During Therapy: WFL for tasks assessed/performed Overall Cognitive Status: Within Functional Limits for tasks assessed                                     General Comments       Exercises     Shoulder Instructions      Home Living Family/patient expects to be discharged to:: Private residence Living Arrangements: Spouse/significant other Available Help at Discharge: Family Type of Home: House             Bathroom Shower/Tub: Tub/shower unit;Walk-in shower   Bathroom Toilet: Standard Bathroom Accessibility: Yes How Accessible: Accessible via walker Home Equipment: None          Prior Functioning/Environment Level of Independence: Independent        Comments: 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        OT Problem List: Impaired UE functional use;Impaired sensation      OT Treatment/Interventions:      OT Goals(Current goals can be found in the care plan section) Acute Rehab OT Goals Patient Stated Goal: to be able to use my LUE and hand to be able to go back to work as a family Medicine doctor OT Goal Formulation: With patient Time For Goal Achievement: 09/04/18 Potential to Achieve Goals: Good  OT Frequency:     Barriers to D/C:            Co-evaluation              AM-PAC PT "6 Clicks" Daily Activity     Outcome Measure Help from another person eating meals?: None Help from another person taking care of personal grooming?: None Help from another person toileting, which includes using toliet, bedpan, or urinal?: None Help from another person bathing (including washing, rinsing, drying)?: None Help from another person to put on and taking off regular upper body  clothing?: None Help from another person to put on and taking off regular lower body clothing?: None 6 Click Score: 24   End of Session    Activity Tolerance: Patient tolerated treatment well Patient left: in bed;with call bell/phone within reach  OT Visit Diagnosis: Other symptoms and signs involving the nervous system (R29.898)  Time: 1791-9957 OT Time Calculation (min): 40 min Charges:  OT General Charges $OT Visit: 1 Visit OT Evaluation $OT Eval Low Complexity: 1 Low OT Treatments $Therapeutic Exercise: 23-37 mins  Chrys Racer, OTR/L ascom [phone] 09/04/18, 12:45 PM

## 2018-09-04 NOTE — Plan of Care (Signed)
  Problem: Education: Goal: Knowledge of General Education information will improve Description Including pain rating scale, medication(s)/side effects and non-pharmacologic comfort measures Outcome: Progressing   Problem: Clinical Measurements: Goal: Ability to maintain clinical measurements within normal limits will improve Outcome: Progressing   Problem: Education: Goal: Knowledge of secondary prevention will improve Outcome: Progressing Goal: Knowledge of patient specific risk factors addressed and post discharge goals established will improve Outcome: Progressing   Problem: Ischemic Stroke/TIA Tissue Perfusion: Goal: Complications of ischemic stroke/TIA will be minimized Outcome: Progressing

## 2018-09-04 NOTE — Progress Notes (Signed)
*  PRELIMINARY RESULTS* Echocardiogram 2D Echocardiogram has been performed.  Jessica Dodson 09/04/2018, 9:29 AM

## 2018-09-05 ENCOUNTER — Emergency Department: Payer: 59

## 2018-09-05 ENCOUNTER — Emergency Department
Admission: EM | Admit: 2018-09-05 | Discharge: 2018-09-05 | Disposition: A | Discharge status: Home / Self Care | Payer: 59 | Attending: Emergency Medicine | Admitting: Emergency Medicine

## 2018-09-05 ENCOUNTER — Encounter: Payer: Self-pay | Admitting: Emergency Medicine

## 2018-09-05 ENCOUNTER — Other Ambulatory Visit: Payer: Self-pay

## 2018-09-05 ENCOUNTER — Telehealth: Payer: Self-pay

## 2018-09-05 DIAGNOSIS — I82611 Acute embolism and thrombosis of superficial veins of right upper extremity: Secondary | ICD-10-CM | POA: Insufficient documentation

## 2018-09-05 DIAGNOSIS — M79601 Pain in right arm: Secondary | ICD-10-CM | POA: Diagnosis present

## 2018-09-05 DIAGNOSIS — I82612 Acute embolism and thrombosis of superficial veins of left upper extremity: Secondary | ICD-10-CM | POA: Diagnosis not present

## 2018-09-05 DIAGNOSIS — Z79899 Other long term (current) drug therapy: Secondary | ICD-10-CM | POA: Insufficient documentation

## 2018-09-05 LAB — HIV ANTIBODY (ROUTINE TESTING W REFLEX): HIV Screen 4th Generation wRfx: NONREACTIVE

## 2018-09-05 MED ORDER — CLOPIDOGREL BISULFATE 75 MG PO TABS
75.0000 mg | ORAL_TABLET | Freq: Once | ORAL | Status: AC
  Administered 2018-09-05: 75 mg via ORAL
  Filled 2018-09-05: qty 1

## 2018-09-05 NOTE — ED Notes (Signed)
Patient has a round swollen area to her left AC area after having an 18g IV in left arm that was never used or flushed approx. 30 hours ago.  Patient is in no obvious distress at this time.  Patient denies any chest pain or leg pain.  No redness or severe pain to her left arm.

## 2018-09-05 NOTE — Telephone Encounter (Signed)
Transition Care Management Follow-up Telephone Call   Date discharged? 09/04/2018   How have you been since you were released from the hospital? Doing better, getting more sensation in her left arm.   Do you understand why you were in the hospital? Yes-left arm numbness. Question TIA?   Do you understand the discharge instructions? Yes   Where were you discharged to? Home   Items Reviewed:  Medications reviewed: Yes- started Plavix and statin.  Allergies reviewed: Yes  Dietary changes reviewed: Yes-heart healthy.       Referrals reviewed: Yes, patient was recommended to start OT-patient will discuss at her visit.  Functional Questionnaire:   Activities of Daily Living (ADLs):   She states they are independent in the following: ambulation, feeding, continence, grooming, toileting, bathing, hygiene. States they require assistance with the following: none.   Any transportation issues/concerns?: No   Any patient concerns? No   Confirmed importance and date/time of follow-up visits scheduled Yes.  Provider Appointment booked with Alma Friendly on 09/12/2018 at 12 pm.  Confirmed with patient if condition begins to worsen call PCP or go to the ER.  Patient was given the office number and encouraged to call back with question or concerns.  : Yes.

## 2018-09-05 NOTE — Discharge Instructions (Signed)
Please call and schedule an appointment with primary care as well as your neurologist.  Return to the ER for worsening or new symptoms if unable to schedule an appointment.  Apply a warm compress and keep the arm elevated as often as possible through the day.

## 2018-09-05 NOTE — ED Provider Notes (Signed)
[facility] Emergency Department Provider Note ____________________________________________  Time seen: Approximately 3:10 PM  I have reviewed the triage vital signs and the nursing notes.   HISTORY  Chief Complaint Arm Pain    HPI Jessica Dodson is a 52 y.o. female who presents to the emergency department for evaluation and treatment of lesion to the left AC tenderness. She was admitted on 09/03/2018 for possible CVA. She was discharged home on plavix and ASA. She had an IV in that area and now feels a "cord."    Past Medical History:  Diagnosis Date  . ADHD (attention deficit hyperactivity disorder)   . Allergy   . Cold sore   . Complication of anesthesia    hard to wake up after tonsillectomy and nasoseptoplasty  . Depression    recurrent, not on meds at this time  . Depression   . H/O alcohol abuse    19 years sober  . Headache    migraine (once a year) usually has an aura  . Migraine with aura   . Pneumonia 2016    Patient Active Problem List   Diagnosis Date Noted  . TIA (transient ischemic attack) 09/03/2018  . ADHD 12/07/2016  . Essential tremor 07/21/2016  . Herpes labialis 02/24/2016  . Preventative health care 10/20/2015  . Breast mass in female 10/12/2015  . Depression 06/30/2015    Past Surgical History:  Procedure Laterality Date  . BREAST BIOPSY Right   . BREAST CYST ASPIRATION Left   . BREAST SURGERY Right    biopsy - benign  . COLONOSCOPY    . LIPOMA EXCISION Left    6 cm tumor  . MASS EXCISION Left 05/08/2015   Procedure: EXCISION OF LEFT DELTOID MASS;  Surgeon: [name], MD;  Location: [facility];  Service: General;  Laterality: Left;  . NASAL SEPTUM SURGERY    . TONSILLECTOMY    . urethral stricture surgery     at age 25    Prior to Admission medications   Medication Sig Start Date End Date Taking? Authorizing Provider  atorvastatin (LIPITOR) 40 MG tablet Take 1 tablet (40 mg total) by mouth daily at 6 PM.  09/04/18   Max Sane, MD  Cholecalciferol (VITAMIN D3) 5000 UNITS TABS Take 5,000 Units by mouth once a week.    [provider]  clopidogrel (PLAVIX) 75 MG tablet Take 1 tablet (75 mg total) by mouth daily. 09/04/18 09/04/19  Max Sane, MD  escitalopram (LEXAPRO) 10 MG tablet TAKE 1 TABLET BY MOUTH DAILY. Patient taking differently: Take 10 mg by mouth daily.  07/31/18   Pleas Koch, NP  primidone (MYSOLINE) 50 MG tablet Take 1 tablet (50 mg total) by mouth 2 (two) times daily. Patient not taking: Reported on 12/18/2017 12/15/16   Star Age, MD  valACYclovir (VALTREX) 1000 MG tablet TAKE 2 TABLETS BY MOUTH TWICE DAILY FOR 1 DAY AS NEEDED FOR BREAKOUTS. Patient taking differently: Take 2,000 mg by mouth 2 (two) times daily as needed (for breakouts). Take 2 tablets by mouth twice daily for 1 day as needed for breakouts. 10/03/17   Pleas Koch, NP  vitamin B-12 (CYANOCOBALAMIN) 500 MCG tablet Take 500 mcg by mouth once a week.     [provider]    Allergies Patient has no known allergies.  Family History  Problem Relation Age of Onset  . Obesity Mother   . High Cholesterol Mother   . Arthritis Mother   . Hyperlipidemia Mother   .  Hypertension Mother   . High Cholesterol Father   . Hypothyroidism Father   . Prostate cancer Father   . Hyperlipidemia Father   . Hypertension Father   . Prostate cancer Maternal Uncle   . Colon cancer Maternal Uncle   . Breast cancer Paternal Aunt   . Arthritis Maternal Grandmother   . Stroke Maternal Grandmother   . Hypertension Maternal Grandmother   . Mental illness Maternal Grandmother   . Hypertension Maternal Grandfather   . Hyperlipidemia Paternal Grandmother   . Stroke Paternal Grandmother   . Prostate cancer Paternal Grandfather   . Hyperlipidemia Paternal Grandfather   . Heart disease Paternal Grandfather   . Hypertension Paternal Grandfather   . Prostate cancer Maternal Uncle   . Breast cancer Paternal  Aunt   . Diabetes Paternal Uncle     Social History Social History   Tobacco Use  . Smoking status: Never Smoker  . Smokeless tobacco: Never Used  Substance Use Topics  . Alcohol use: Never    Alcohol/week: 0.0 standard drinks    Frequency: Never    Comment: sober for 23 years  . Drug use: No    Review of Systems Constitutional: Negative for fever. Cardiovascular: Negative for chest pain. Respiratory: Negative for shortness of breath. Musculoskeletal: Negative for decreased range of motion Skin: Positive for feeling of palpable knot in the left AC. Neurological: Positive for decreased sensation in the left arm that is not an acute concern ____________________________________________   PHYSICAL EXAM:  VITAL SIGNS: ED Triage Vitals  Enc Vitals Group     BP 09/05/18 1419 (!) 143/72     Pulse Rate 09/05/18 1419 60     Resp 09/05/18 1419 18     Temp 09/05/18 1419 97.6 F (36.4 C)     Temp Source 09/05/18 1419 Oral     SpO2 09/05/18 1419 100 %     Weight 09/05/18 1420 132 lb (59.9 kg)     Height 09/05/18 1420 5\' 5"  (1.651 m)     Head Circumference --      Peak Flow --      Pain Score 09/05/18 1428 2     Pain Loc --      Pain Edu? --      Excl. in [place]? --     Constitutional: Alert and oriented. Well appearing and in no acute distress. Eyes: Conjunctivae are clear without discharge or drainage Head: Atraumatic Neck: Supple. Respiratory: No cough. Respirations are even and unlabored. Musculoskeletal: Full, active range of motion of the extremities including the left elbow. Neurologic: Motor sensation of the left arm is normal.  Sensory function is subacutely decreased in the left arm. Skin: Palpable cordlike structure over the basilic vein in the left AC.  Psychiatric: Affect and behavior are appropriate.  ____________________________________________   LABS (all labs ordered are listed, but only abnormal results are displayed)  Labs Reviewed - No data to  display ____________________________________________  RADIOLOGY  Superficial thrombosis of a short segment of the basilic vein at the [facility].  ____________________________________________   PROCEDURES  Procedures  ____________________________________________   INITIAL IMPRESSION / ASSESSMENT AND PLAN / ED COURSE  MIREILLE LACOMBE is a 52 y.o. who presents to the emergency department for treatment and evaluation of a cord like structure in the left [facility] after IV was removed yesterday.  Ultrasound does show a superficial thrombosis in the basilic vein but no evidence for deep vein thrombosis.  Results were reviewed with the patient and husband  who would like for vascular to be consulted to make sure that she does not need to be on Xarelto in addition to her aspirin and Plavix and that warm compresses and elevation will be sufficient treatment.  Case was discussed with Dr. Lucky Cowboy who confirms that treatment plan of warm compress and elevation is appropriate.  She was advised that if the arm becomes red, swollen, hard, or increasingly painful that she should follow-up with either her primary care or return to the emergency department for a repeat ultrasound.  Patient and husband were reassured and were discharged home.  Medications  clopidogrel (PLAVIX) tablet 75 mg (75 mg Oral Given 09/05/18 1725)    Pertinent labs & imaging results that were available during my care of the patient were reviewed by me and considered in my medical decision making (see chart for details).  _________________________________________   FINAL CLINICAL IMPRESSION(S) / ED DIAGNOSES  Final diagnoses:  Acute thrombosis of superficial veins of arm, right    ED Discharge Orders    None       If controlled substance prescribed during this visit, 12 month history viewed on the [place] prior to issuing an initial prescription for Schedule II or III opiod.    Victorino Dike, FNP 09/05/18 2229    Eula Listen, MD 09/05/18 2340

## 2018-09-05 NOTE — ED Triage Notes (Signed)
Patient reports that she was admitted as a code stroke in ED on 10/28 at which time an 75 G IV was placed in her left antecubital. States she does not recall IV being used after initial placement, including flushing of line, however BP was taken multiple times in her left arm. States that IV was removed at discharge yesterday and at this time she is experiencing pain to insertion site and in arm above site. Patient also reports feeling hard knot under skin at insertion site. Small hard knot noted to left antecubital vein. No redness or swelling or heat noted to area.

## 2018-09-06 DIAGNOSIS — F411 Generalized anxiety disorder: Secondary | ICD-10-CM

## 2018-09-06 MED ORDER — BUSPIRONE HCL 10 MG PO TABS: 10.0000 mg | ORAL_TABLET | Freq: Two times a day (BID) | ORAL | 0 refills | Status: DC

## 2018-09-06 NOTE — Discharge Summary (Signed)
[facility] [place] NAME: Jessica Dodson    MR#:  [id number]  DATE OF BIRTH:  1966-03-09  DATE OF ADMISSION:  09/03/2018   ADMITTING PHYSICIAN: Max Sane, MD  DATE OF DISCHARGE: 09/04/2018  2:57 PM  PRIMARY CARE PHYSICIAN: Pleas Koch, NP   ADMISSION DIAGNOSIS:  Numbness [R20.0] Cerebrovascular accident (CVA), unspecified mechanism ([place]) [I63.9] DISCHARGE DIAGNOSIS:  Active Problems:   TIA (transient ischemic attack)  SECONDARY DIAGNOSIS:   Past Medical History:  Diagnosis Date  . ADHD (attention deficit hyperactivity disorder)   . Allergy   . Cold sore   . Complication of anesthesia    hard to wake up after tonsillectomy and nasoseptoplasty  . Depression    recurrent, not on meds at this time  . Depression   . H/O alcohol abuse    19 years sober  . Headache    migraine (once a year) usually has an aura  . Migraine with aura   . Pneumonia 2016   HOSPITAL COURSE:  52 y.o.femaleadmitted with sudden left arm numbness without other associated symptoms. Continued left arm symptoms.  MRI cervical spine performed to rule out cervical etiology, reviewed and did not show significant disc protrusion, spinal or foraminal stenosis.  Mild left foraminal encroachment at C7-T1 noted with no stenosis.  Echocardiogram normal.  US carotids bilaterally showed minimal left sided atherosclerotic plate without hemodynamically significant stenosis.  Hemoglobin A1c 5.0, LDL 108.  * Left arm numbness: Neuro recommends switching Aspirin to Plavix 75 mg /day at D/C - statin with goal low density lipoprotein (LDL) <70 mg/dl - follow up with neurology on an outpatient basis. DISCHARGE CONDITIONS:  stable CONSULTS OBTAINED:  Treatment Team:  Catarina Hartshorn, MD DRUG ALLERGIES:  No Known Allergies DISCHARGE MEDICATIONS:   Allergies as of 09/04/2018   No Known Allergies     Medication List    STOP taking these medications     aspirin EC 81 MG tablet     TAKE these medications   atorvastatin 40 MG tablet Commonly known as:  LIPITOR Take 1 tablet (40 mg total) by mouth daily at 6 PM.   clopidogrel 75 MG tablet Commonly known as:  PLAVIX Take 1 tablet (75 mg total) by mouth daily.   escitalopram 10 MG tablet Commonly known as:  LEXAPRO TAKE 1 TABLET BY MOUTH DAILY.   primidone 50 MG tablet Commonly known as:  MYSOLINE Take 1 tablet (50 mg total) by mouth 2 (two) times daily.   valACYclovir 1000 MG tablet Commonly known as:  VALTREX TAKE 2 TABLETS BY MOUTH TWICE DAILY FOR 1 DAY AS NEEDED FOR BREAKOUTS. What changed:  See the new instructions.   vitamin B-12 500 MCG tablet Commonly known as:  CYANOCOBALAMIN Take 500 mcg by mouth once a week.   Vitamin D3 5000 units Tabs Take 5,000 Units by mouth once a week.        DISCHARGE INSTRUCTIONS:   DIET:  Regular diet DISCHARGE CONDITION:  Good ACTIVITY:  Activity as tolerated OXYGEN:  Home Oxygen: No.  Oxygen Delivery: room air DISCHARGE LOCATION:  home   If you experience worsening of your admission symptoms, develop shortness of breath, life threatening emergency, suicidal or homicidal thoughts you must seek medical attention immediately by calling 911 or calling your MD immediately  if symptoms less severe.  You Must read complete instructions/literature along with all the possible adverse reactions/side effects for all the Medicines you take and that have  been prescribed to you. Take any new Medicines after you have completely understood and accpet all the possible adverse reactions/side effects.   Please note  You were cared for by a hospitalist during your hospital stay. If you have any questions about your discharge medications or the care you received while you were in the hospital after you are discharged, you can call the unit and asked to speak with the hospitalist on call if the hospitalist that took care of you is not available.  Once you are discharged, your primary care physician will handle any further medical issues. Please note that NO REFILLS for any discharge medications will be authorized once you are discharged, as it is imperative that you return to your primary care physician (or establish a relationship with a primary care physician if you do not have one) for your aftercare needs so that they can reassess your need for medications and monitor your lab values.    On the day of Discharge:  VITAL SIGNS:  Blood pressure 131/77, pulse (!) 51, temperature (!) 97.5 F (36.4 C), temperature source Oral, resp. rate 16, height 5\' 5"  (1.651 m), weight 61.5 kg, last menstrual period 09/01/2018, SpO2 100 %. PHYSICAL EXAMINATION:  GENERAL:  52 y.o.-year-old patient lying in the bed with no acute distress.  EYES: Pupils equal, round, reactive to light and accommodation. No scleral icterus. Extraocular muscles intact.  HEENT: Head atraumatic, normocephalic. Oropharynx and nasopharynx clear.  NECK:  Supple, no jugular venous distention. No thyroid enlargement, no tenderness.  LUNGS: Normal breath sounds bilaterally, no wheezing, rales,rhonchi or crepitation. No use of accessory muscles of respiration.  CARDIOVASCULAR: S1, S2 normal. No murmurs, rubs, or gallops.  ABDOMEN: Soft, non-tender, non-distended. Bowel sounds present. No organomegaly or mass.  EXTREMITIES: No pedal edema, cyanosis, or clubbing.  NEUROLOGIC: Cranial nerves II through XII are intact. Muscle strength 5/5 in all extremities. Sensation intact. Gait not checked.  PSYCHIATRIC: The patient is alert and oriented x 3.  SKIN: No obvious rash, lesion, or ulcer.  DATA REVIEW:   CBC Recent Labs  Lab 09/03/18 0808  WBC 4.8  HGB 13.3  HCT 39.8  PLT 227    Chemistries  Recent Labs  Lab 09/03/18 0808  NA 141  K 3.5  CL 104  CO2 28  GLUCOSE 84  BUN 10  CREATININE 0.63  CALCIUM 9.1  AST 24  ALT 16  ALKPHOS 41  BILITOT 0.8     Microbiology  Results  Results for orders placed or performed during the hospital encounter of 10/19/16  Rapid Influenza A&B Antigens ([facility] only)     Status: None   Collection Time: 10/19/16  9:42 AM  Result Value Ref Range Status   Influenza A ([facility]) NEGATIVE NEGATIVE Final   Influenza B ([facility]) NEGATIVE NEGATIVE Final    Follow-up Information    Pleas Koch, NP. Go on 09/10/2018.   Specialty:  Internal Medicine Why:  @11 :40 AM Contact information: [place] [address] [phone]           Management plans discussed with the patient, family and they are in agreement.  CODE STATUS: Prior   TOTAL TIME TAKING CARE OF THIS PATIENT: 45 minutes.    Max Sane M.D on 09/06/2018 at 7:57 PM  Between 7am to 6pm - Pager - [phone]  After 6pm go to [email or web address] - Proofreader  [facility]  Office  [phone]  CC: Primary care physician; Alma Friendly  K, NP   Note: This dictation was prepared with Dragon dictation along with smaller phrase technology. Any transcriptional errors that result from this process are unintentional.

## 2018-09-10 ENCOUNTER — Emergency Department (HOSPITAL_COMMUNITY): Payer: 59

## 2018-09-10 ENCOUNTER — Inpatient Hospital Stay: Payer: 59 | Admitting: Primary Care

## 2018-09-10 ENCOUNTER — Other Ambulatory Visit: Payer: Self-pay

## 2018-09-10 ENCOUNTER — Emergency Department (HOSPITAL_COMMUNITY)
Admission: EM | Admit: 2018-09-10 | Discharge: 2018-09-11 | Disposition: A | Discharge status: Home / Self Care | Payer: 59 | Attending: Emergency Medicine | Admitting: Emergency Medicine

## 2018-09-10 DIAGNOSIS — R079 Chest pain, unspecified: Secondary | ICD-10-CM | POA: Insufficient documentation

## 2018-09-10 DIAGNOSIS — I8290 Acute embolism and thrombosis of unspecified vein: Secondary | ICD-10-CM

## 2018-09-10 DIAGNOSIS — I82402 Acute embolism and thrombosis of unspecified deep veins of left lower extremity: Secondary | ICD-10-CM | POA: Diagnosis not present

## 2018-09-10 DIAGNOSIS — M79602 Pain in left arm: Secondary | ICD-10-CM | POA: Diagnosis not present

## 2018-09-10 DIAGNOSIS — Z7902 Long term (current) use of antithrombotics/antiplatelets: Secondary | ICD-10-CM | POA: Diagnosis not present

## 2018-09-10 DIAGNOSIS — I82622 Acute embolism and thrombosis of deep veins of left upper extremity: Secondary | ICD-10-CM | POA: Diagnosis not present

## 2018-09-10 DIAGNOSIS — Z79899 Other long term (current) drug therapy: Secondary | ICD-10-CM | POA: Insufficient documentation

## 2018-09-10 DIAGNOSIS — R0789 Other chest pain: Secondary | ICD-10-CM | POA: Diagnosis not present

## 2018-09-10 DIAGNOSIS — M25512 Pain in left shoulder: Secondary | ICD-10-CM | POA: Diagnosis present

## 2018-09-10 LAB — CBC
HCT: 39.9 % (ref 36.0–46.0)
Hemoglobin: 13 g/dL (ref 12.0–15.0)
MCH: 31.3 pg (ref 26.0–34.0)
MCHC: 32.6 g/dL (ref 30.0–36.0)
MCV: 96.1 fL (ref 80.0–100.0)
PLATELETS: 256 10*3/uL (ref 150–400)
RBC: 4.15 MIL/uL (ref 3.87–5.11)
RDW: 11.9 % (ref 11.5–15.5)
WBC: 5.2 10*3/uL (ref 4.0–10.5)
nRBC: 0 % (ref 0.0–0.2)

## 2018-09-10 LAB — I-STAT TROPONIN, ED: TROPONIN I, POC: 0 ng/mL (ref 0.00–0.08)

## 2018-09-10 LAB — BASIC METABOLIC PANEL
Anion gap: 10 (ref 5–15)
BUN: 9 mg/dL (ref 6–20)
CALCIUM: 9.3 mg/dL (ref 8.9–10.3)
CO2: 27 mmol/L (ref 22–32)
Chloride: 100 mmol/L (ref 98–111)
Creatinine, Ser: 0.68 mg/dL (ref 0.44–1.00)
GFR calc Af Amer: >60 mL/min (ref >60)
GFR calc non Af Amer: >60 mL/min (ref >60)
GLUCOSE: 90 mg/dL (ref 70–99)
Potassium: 3.5 mmol/L (ref 3.5–5.1)
Sodium: 137 mmol/L (ref 135–145)

## 2018-09-10 LAB — I-STAT BETA HCG BLOOD, ED (MC, WL, AP ONLY): I-stat hCG, quantitative: <5 m[IU]/mL (ref <5)

## 2018-09-10 MED ORDER — IOPAMIDOL (ISOVUE-370) INJECTION 76%
100.0000 mL | Freq: Once | INTRAVENOUS | Status: AC | PRN
  Administered 2018-09-10: 100 mL via INTRAVENOUS

## 2018-09-10 MED ORDER — IOPAMIDOL (ISOVUE-370) INJECTION 76%
INTRAVENOUS | Status: AC
  Filled 2018-09-10: qty 100

## 2018-09-10 NOTE — ED Notes (Signed)
Pt requesting IV to be removed. IV removed per request

## 2018-09-10 NOTE — ED Provider Notes (Signed)
Patient placed in Quick Look pathway, seen and evaluated   Chief Complaint: left shoulder/chest pain   HPI: Jessica Dodson is a 52 y.o. female who present to the ED with  left shoulder/chest pain that has been constant since last night. Patient reports one week ago she went to [facility] and was worked up as Code Stroke. Patient states everything came back ok but she noted swelling to the left chest wall last night and shortness of breath that is worse today. Patient reports she has been doing some traveling that has had her sitting for extended periods of time.     ROS: Pulmonary: shortness of breath, chest pain   Physical Exam:  BP 133/81 (BP Location: Right Arm)   Pulse 72   Temp 98.2 F (36.8 C) (Oral)   Resp 16   LMP 09/01/2018   SpO2 100%    Gen: No distress  Neuro: Awake and Alert  Skin: Warm and dry  Heart: regular rate and rhythm  Lungs: without wheezing or rales heard.    Initiation of care has begun. The patient has been counseled on the process, plan, and necessity for staying for the completion/evaluation, and the remainder of the medical screening examination    Ashley Murrain, NP 09/10/18 1807    Julianne Rice, MD 09/12/18 [phone]

## 2018-09-10 NOTE — ED Triage Notes (Signed)
Patient to ED c/o L shoulder/chest pain, constant since last night. She reports she was a "Code Stroke" last Monday at [facility] for L arm tingling/numbness, which since has not changed, states "everything came back fine." Patient endorses pain is bad enough it warranted her coming straight here - also c/o some swelling to the L clavicular area. Denies shortness of breath, dizziness, N/V. Did have superficial blood clot secondary to antecubital PIV during her hospital stay (see notes) and did some traveling early September. Pain non-reproducible, nothing makes it worse or better.

## 2018-09-11 ENCOUNTER — Emergency Department (HOSPITAL_BASED_OUTPATIENT_CLINIC_OR_DEPARTMENT_OTHER)
Admission: RE | Admit: 2018-09-11 | Discharge: 2018-09-11 | Disposition: A | Discharge status: Home / Self Care | Payer: 59 | Source: Ambulatory Visit | Attending: Physician Assistant | Admitting: Physician Assistant

## 2018-09-11 DIAGNOSIS — Z7902 Long term (current) use of antithrombotics/antiplatelets: Secondary | ICD-10-CM | POA: Diagnosis not present

## 2018-09-11 DIAGNOSIS — R52 Pain, unspecified: Secondary | ICD-10-CM | POA: Diagnosis not present

## 2018-09-11 DIAGNOSIS — R079 Chest pain, unspecified: Secondary | ICD-10-CM | POA: Diagnosis not present

## 2018-09-11 DIAGNOSIS — I82622 Acute embolism and thrombosis of deep veins of left upper extremity: Secondary | ICD-10-CM | POA: Diagnosis not present

## 2018-09-11 DIAGNOSIS — Z79899 Other long term (current) drug therapy: Secondary | ICD-10-CM | POA: Diagnosis not present

## 2018-09-11 LAB — I-STAT TROPONIN, ED: Troponin i, poc: 0 ng/mL (ref 0.00–0.08)

## 2018-09-11 MED ORDER — RIVAROXABAN 15 MG PO TABS
15.0000 mg | ORAL_TABLET | Freq: Once | ORAL | Status: AC
  Administered 2018-09-11: 15 mg via ORAL
  Filled 2018-09-11: qty 1

## 2018-09-11 NOTE — ED Provider Notes (Signed)
[facility] EMERGENCY DEPARTMENT Provider Note   CSN: [id number] Arrival date & time: 09/10/18  1719     History   Chief Complaint Chief Complaint  Patient presents with  . Chest Pain    HPI Jessica Dodson is a 52 y.o. female with past medical history of anxiety, who presents today for evaluation of left-sided shoulder/chest pain that is been constant since last night.  Approximately 1 week ago she went to [facility] where she was worked up as a code stroke due to numbness in her left arm.  Patient was seen on Wednesday for swelling in her left [facility] where she had a IV during her admission, and diagnosed with a superficial venous thrombosis.  Patient is very concerned that this may be extending and becoming a DVT.   HPI  Past Medical History:  Diagnosis Date  . ADHD (attention deficit hyperactivity disorder)   . Allergy   . Cold sore   . Complication of anesthesia    hard to wake up after tonsillectomy and nasoseptoplasty  . Depression    recurrent, not on meds at this time  . Depression   . H/O alcohol abuse    19 years sober  . Headache    migraine (once a year) usually has an aura  . Migraine with aura   . Pneumonia 2016    Patient Active Problem List   Diagnosis Date Noted  . TIA (transient ischemic attack) 09/03/2018  . ADHD 12/07/2016  . Essential tremor 07/21/2016  . Herpes labialis 02/24/2016  . Preventative health care 10/20/2015  . Breast mass in female 10/12/2015  . Depression 06/30/2015    Past Surgical History:  Procedure Laterality Date  . BREAST BIOPSY Right   . BREAST CYST ASPIRATION Left   . BREAST SURGERY Right    biopsy - benign  . COLONOSCOPY    . LIPOMA EXCISION Left    6 cm tumor  . MASS EXCISION Left 05/08/2015   Procedure: EXCISION OF LEFT DELTOID MASS;  Surgeon: [name], MD;  Location: [facility];  Service: General;  Laterality: Left;  . NASAL SEPTUM SURGERY    . TONSILLECTOMY    . urethral stricture surgery     at  age 59     OB History    Gravida  0   Para  0   Term  0   Preterm  0   AB  0   Living        SAB  0   TAB  0   Ectopic  0   Multiple      Live Births               Home Medications    Prior to Admission medications   Medication Sig Start Date End Date Taking? Authorizing Provider  atorvastatin (LIPITOR) 40 MG tablet Take 1 tablet (40 mg total) by mouth daily at 6 PM. 09/04/18   Max Sane, MD  busPIRone (BUSPAR) 10 MG tablet Take 1 tablet (10 mg total) by mouth 2 (two) times daily. For anxiety. 09/06/18   Pleas Koch, NP  Cholecalciferol (VITAMIN D3) 5000 UNITS TABS Take 5,000 Units by mouth once a week.    [provider]  clopidogrel (PLAVIX) 75 MG tablet Take 1 tablet (75 mg total) by mouth daily. 09/04/18 09/04/19  Max Sane, MD  escitalopram (LEXAPRO) 10 MG tablet TAKE 1 TABLET BY MOUTH DAILY. Patient taking differently: Take 10 mg by mouth daily.  07/31/18   Pleas Koch, NP  primidone (MYSOLINE) 50 MG tablet Take 1 tablet (50 mg total) by mouth 2 (two) times daily. Patient not taking: Reported on 12/18/2017 12/15/16   Star Age, MD  valACYclovir (VALTREX) 1000 MG tablet TAKE 2 TABLETS BY MOUTH TWICE DAILY FOR 1 DAY AS NEEDED FOR BREAKOUTS. Patient taking differently: Take 2,000 mg by mouth 2 (two) times daily as needed (for breakouts). Take 2 tablets by mouth twice daily for 1 day as needed for breakouts. 10/03/17   Pleas Koch, NP  vitamin B-12 (CYANOCOBALAMIN) 500 MCG tablet Take 500 mcg by mouth once a week.     [provider]    Family History Family History  Problem Relation Age of Onset  . Obesity Mother   . High Cholesterol Mother   . Arthritis Mother   . Hyperlipidemia Mother   . Hypertension Mother   . High Cholesterol Father   . Hypothyroidism Father   . Prostate cancer Father   . Hyperlipidemia Father   . Hypertension Father   . Prostate cancer Maternal Uncle   . Colon cancer Maternal Uncle   .  Breast cancer Paternal Aunt   . Arthritis Maternal Grandmother   . Stroke Maternal Grandmother   . Hypertension Maternal Grandmother   . Mental illness Maternal Grandmother   . Hypertension Maternal Grandfather   . Hyperlipidemia Paternal Grandmother   . Stroke Paternal Grandmother   . Prostate cancer Paternal Grandfather   . Hyperlipidemia Paternal Grandfather   . Heart disease Paternal Grandfather   . Hypertension Paternal Grandfather   . Prostate cancer Maternal Uncle   . Breast cancer Paternal Aunt   . Diabetes Paternal Uncle     Social History Social History   Tobacco Use  . Smoking status: Never Smoker  . Smokeless tobacco: Never Used  Substance Use Topics  . Alcohol use: Never    Alcohol/week: 0.0 standard drinks    Frequency: Never    Comment: sober for 23 years  . Drug use: No     Allergies   Patient has no known allergies.   Review of Systems Review of Systems  Constitutional: Negative for activity change and fever.  HENT: Negative for congestion.   Respiratory: Negative for chest tightness and shortness of breath.   Cardiovascular: Positive for chest pain (Left upper/anterior).  All other systems reviewed and are negative.    Physical Exam Updated Vital Signs BP 135/66 (BP Location: Right Arm)   Pulse 68   Temp 98.1 F (36.7 C) (Oral)   Resp 17   LMP 09/01/2018   SpO2 100%   Physical Exam  Constitutional: She appears well-developed and well-nourished. No distress.  HENT:  Head: Normocephalic and atraumatic.  Eyes: Conjunctivae are normal.  Neck: Neck supple.  Cardiovascular: Normal rate, regular rhythm, intact distal pulses and normal pulses.  No murmur heard. There is a palpable thrombus in the left AC vein.  This appears to be about 2 to 3 cm long.  Distal from this the veins are distended.  Questionable cord in the left arm proximal to the [facility] vein.  Pulmonary/Chest: Effort normal and breath sounds normal. No respiratory distress. She has  no decreased breath sounds.  Abdominal: Soft. She exhibits no distension.  Musculoskeletal: She exhibits no edema.  Unable to re-create pain and arm with arm movement.  Neurological: She is alert.  Skin: Skin is warm and dry.  Psychiatric: Her mood appears anxious.  Nursing note and vitals reviewed.  ED Treatments / Results  Labs (all labs ordered are listed, but only abnormal results are displayed) Labs Reviewed  BASIC METABOLIC PANEL  CBC  I-STAT TROPONIN, ED  I-STAT BETA HCG BLOOD, ED ([facility], [facility], [facility] ONLY)  I-STAT TROPONIN, ED    EKG None  Radiology Dg Chest 2 View  Result Date: 09/10/2018 CLINICAL DATA:  Shoulder and chest pain EXAM: CHEST - 2 VIEW COMPARISON:  09/03/2018 FINDINGS: The heart size and mediastinal contours are within normal limits. Both lungs are clear. The visualized skeletal structures are unremarkable. IMPRESSION: No active cardiopulmonary disease. Electronically Signed   By: Donavan Foil M.D.   On: 09/10/2018 19:36   Ct Angio Chest Pe W And/or Wo Contrast  Result Date: 09/10/2018 CLINICAL DATA:  Left shoulder and chest pain EXAM: CT ANGIOGRAPHY CHEST WITH CONTRAST TECHNIQUE: Multidetector CT imaging of the chest was performed using the standard protocol during bolus administration of intravenous contrast. Multiplanar CT image reconstructions and MIPs were obtained to evaluate the vascular anatomy. CONTRAST:  119mL ISOVUE-370 IOPAMIDOL (ISOVUE-370) INJECTION 76% COMPARISON:  Chest x-ray 09/10/2018, CT 02/27/2017 FINDINGS: Cardiovascular: Satisfactory opacification of the pulmonary arteries to the segmental level. No evidence of pulmonary embolism. Normal heart size. No pericardial effusion. Mediastinum/Nodes: No enlarged mediastinal, hilar, or axillary lymph nodes. Thyroid gland, trachea, and esophagus demonstrate no significant findings. Lungs/Pleura: Lungs are clear. No pleural effusion or pneumothorax. Upper Abdomen: No acute abnormality. Musculoskeletal: No  chest wall abnormality. No acute or significant osseous findings. Review of the MIP images confirms the above findings. IMPRESSION: Negative.  No CT evidence for acute pulmonary embolus. Electronically Signed   By: Donavan Foil M.D.   On: 09/10/2018 20:01    Procedures Procedures (including critical care time)  Medications Ordered in ED Medications  iopamidol (ISOVUE-370) 76 % injection 100 mL (100 mLs Intravenous Contrast Given 09/10/18 1937)  Rivaroxaban (XARELTO) tablet 15 mg (15 mg Oral Given 09/11/18 0140)     Initial Impression / Assessment and Plan / ED Course  I have reviewed the triage vital signs and the nursing notes.  Pertinent labs & imaging results that were available during my care of the patient were reviewed by me and considered in my medical decision making (see chart for details).    Patient presents today for concern of a extension of a superficial venous thrombosis in her left AC.  She is having left arm aching proximal to this that started last night.  She reports compliance with her Plavix that she is taking.  She appears anxious.  She is very concerned about the possibility of a PE.  CT angios PE study was performed which did not show any evidence of PE or other intrathoracic abnormality.  EKG was obtained and reviewed without acute abnormalities.  Troponin x2 normal.  She does not have significant hematologic or electrolyte abnormalities.  IStat hCG was negative.  Given concern for extension, she is given a dose of Xarelto here and scheduled for an outpatient ultrasound in the morning.  This patient was seen as a shared visit with Dr. Roxanne Mins.  Return precautions were discussed with patient who states their understanding.  At the time of discharge patient denied any unaddressed complaints or concerns.  Patient is agreeable for discharge home.   Final Clinical Impressions(s) / ED Diagnoses   Final diagnoses:  Left arm pain  Blood clot in vein    ED Discharge  Orders         Ordered    UE VENOUS DUPLEX  09/11/18 0130           Lorin Glass, PA-C 47/15/95 [id number]    Delora Fuel, MD 28/97/91 [phone]

## 2018-09-11 NOTE — Discharge Instructions (Addendum)
As you are aware there are risks with blood thinning medications.  You were given a one-time dose today of Xarelto.  Orders have been placed for you to get an ultrasound in the morning of your left arm.  Today your CT scan did not show evidence of a pulmonary embolism.  If you hit your head or have any trauma then you need to seek additional medical care and evaluation.  Please make sure that you are drinking plenty of fluids to help clear the IV contrast that you were given.  Per our discussion I have given you a handout on mindfulness-based stress reduction.

## 2018-09-11 NOTE — Progress Notes (Addendum)
Left upper extremity venous duplex completed. Preliminary results - There is no evidence of a DVT. The superficial thrombosis noted from another facility on 88/89/1694 in the basilic vein at the [facility] is still present with no evidence of propagation. [facility], [place] 09/11/2018 9:13 AM

## 2018-09-12 ENCOUNTER — Ambulatory Visit (INDEPENDENT_AMBULATORY_CARE_PROVIDER_SITE_OTHER): Payer: 59 | Admitting: Primary Care

## 2018-09-12 ENCOUNTER — Encounter: Payer: Self-pay | Admitting: Primary Care

## 2018-09-12 VITALS — BP 104/64 | HR 54 | Temp 98.2°F | Ht 65.0 in | Wt 136.0 lb

## 2018-09-12 DIAGNOSIS — R200 Anesthesia of skin: Secondary | ICD-10-CM

## 2018-09-12 DIAGNOSIS — G250 Essential tremor: Secondary | ICD-10-CM

## 2018-09-12 DIAGNOSIS — F329 Major depressive disorder, single episode, unspecified: Secondary | ICD-10-CM

## 2018-09-12 DIAGNOSIS — F419 Anxiety disorder, unspecified: Secondary | ICD-10-CM

## 2018-09-12 DIAGNOSIS — Z09 Encounter for follow-up examination after completed treatment for conditions other than malignant neoplasm: Secondary | ICD-10-CM

## 2018-09-12 DIAGNOSIS — I82890 Acute embolism and thrombosis of other specified veins: Secondary | ICD-10-CM

## 2018-09-12 DIAGNOSIS — E785 Hyperlipidemia, unspecified: Secondary | ICD-10-CM | POA: Insufficient documentation

## 2018-09-12 DIAGNOSIS — F32A Depression, unspecified: Secondary | ICD-10-CM

## 2018-09-12 HISTORY — DX: Hyperlipidemia, unspecified: E78.5

## 2018-09-12 HISTORY — DX: Anesthesia of skin: R20.0

## 2018-09-12 HISTORY — DX: Acute embolism and thrombosis of other specified veins: I82.890

## 2018-09-12 NOTE — Assessment & Plan Note (Signed)
Overall stable, no longer taking Primidone.

## 2018-09-12 NOTE — Patient Instructions (Signed)
You will be contacted regarding your referral to Occupational Therapy.  Please let us know if you have not been contacted within one week.   Continue clopidogrel 75 mg and atorvastatin 40 mg.   Follow up with neurology as scheduled.  Schedule a lab only appointment for 6 weeks to recheck lipids and liver enzymes. Make sure to fast 4 hours prior.   It was a pleasure to see you today!

## 2018-09-12 NOTE — Assessment & Plan Note (Signed)
Located to left [facility]. Continue clopidogrel. No swelling or erythema noted.

## 2018-09-12 NOTE — Assessment & Plan Note (Signed)
Admitted and underwent work up to rule out CVA. So far all testing is without evidence of CVA/TIA. Extremity numbness could very well be cervical or MSK to left trapezius area causing nerve impingement.  Carotid ultrasound and echo essentially unremarkable. No bruit auscultated on exam. Continue clopidogrel and atorvastatin. Repeat lipids with LFT's in 6 weeks. Follow up with neurology as scheduled. Referral placed for both vascular consult and occupational therapy.   All hospital notes, labs imaging reviewed.

## 2018-09-12 NOTE — Progress Notes (Signed)
Subjective:    Patient ID: Jessica Dodson, female    DOB: 1966/09/03, 52 y.o.   MRN: [id number]  HPI  Jessica Dodson is a 52 year old female who presents today for hospital and emergency department follow up.   She presented to [facility] ED on 09/03/18 with a chief complaint of sudden left upper extremity numbness that occurred at 7:12 AM. She denied weakness, changes in speech. A code stroke was activated. During her stay in the ED she underwent CT head which was negative for acute stroke. She did have decreased grip strength to the left hand. TPA was recommended for which she kindly declined. She was admitted for further work up.  During her hospital stay she underwent MRI of the Cervical Spine which was negative for disc protrusion, spinal/foraminal stenosis. She did have mild foraminal encroachment to C7-T1 without stenosis. Echocardiogram with LVEF of 55-60%, normal wall motion. She underwent bilateral carotid ultrasound with minimal left sided arthrosclerotic plaque, hemodynamically stable. She was switched from aspirin to clopidogrel 75 mg daily, atorvastatin 40 mg was added for an LDL goal of <70. She was discharged home on 09/04/18 with recommendations for outpatient neurology evaluation.  She presented to [facility] ED on 09/05/18 with reports of "cord" like feeling to the [facility] vein with tenderness. She had an 18 gauge IV to that side during her hospital stay. She underwent ultrasound which showed a superficial thrombosis in the left basilic vein without evidence of DVT. She was advised to continue clopidogrel and atorvastatin.   She presented to [facility] on 09/10/18 with reports of left shoulder, chest pain, swelling and shortness of breath that began the night prior. She underwent CT angio of the chest which was negative, ECG without changes. Labs were negative for elevated troponin x 2. She was provided with a dose of Xarelto and advised to undergo repeat ultrasound the following day.   Since her discharge  home she's doing well. She continues to experience numbness to her left upper extremity. She does have ability to preform fine motor skills. She denies neck pain, radiculopathy. She underwent ultrasound of her left upper extremity yesterday which was negative for DVT and positive for superficial thrombosis at the basilic vein without propagation.   She sent a message via My Chart on 09/06/18 requesting Buspar for anxiety symptoms. She had been under a lot of stress given her recent symptoms, and hospital stay. Buspar 10 mg BID was provided. She stopped the Buspar yesterday as it caused her to feel worse. She's compliant to her Lexapro 10 mg and is doing well, overall feeling less anxious. She was told that her symptoms were not from a stroke or TIA. She does have an appointment with neurology in January 2020. She denies changes in speech, unilateral weakness, chest pain.   Review of Systems  Respiratory: Negative for shortness of breath.   Cardiovascular: Negative for chest pain and palpitations.  Musculoskeletal: Negative for neck pain.  Skin: Negative for color change.  Neurological: Positive for numbness.  Psychiatric/Behavioral:       See HPI       Past Medical History:  Diagnosis Date  . ADHD (attention deficit hyperactivity disorder)   . Allergy   . Cold sore   . Complication of anesthesia    hard to wake up after tonsillectomy and nasoseptoplasty  . Depression    recurrent, not on meds at this time  . Depression   . H/O alcohol abuse    19 years sober  .  Headache    migraine (once a year) usually has an aura  . Migraine with aura   . Pneumonia 2016     Social History   Socioeconomic History  . Marital status: Married    Spouse name: Not on file  . Number of children: 0  . Years of education: MD  . Highest education level: Not on file  Occupational History  . Not on file  Social Needs  . Financial resource strain: Not on file  . Food insecurity:    Worry: Not on  file    Inability: Not on file  . Transportation needs:    Medical: Not on file    Non-medical: Not on file  Tobacco Use  . Smoking status: Never Smoker  . Smokeless tobacco: Never Used  Substance and Sexual Activity  . Alcohol use: Never    Alcohol/week: 0.0 standard drinks    Frequency: Never    Comment: sober for 23 years  . Drug use: No  . Sexual activity: Not on file  Lifestyle  . Physical activity:    Days per week: Not on file    Minutes per session: Not on file  . Stress: Not on file  Relationships  . Social connections:    Talks on phone: Not on file    Gets together: Not on file    Attends religious service: Not on file    Active member of club or organization: Not on file    Attends meetings of clubs or organizations: Not on file    Relationship status: Not on file  . Intimate partner violence:    Fear of current or ex partner: Not on file    Emotionally abused: Not on file    Physically abused: Not on file    Forced sexual activity: Not on file  Other Topics Concern  . Not on file  Social History Narrative   ** Merged History Encounter **       ** Data from: 05/12/15 Enc Dept: [place] [facility]       ** Data from: 06/30/15 Enc Dept: [facility]   Married. 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  Live in [place].  Enjoys running, mountain bike, kayak.     Past Surgical History:  Procedure Laterality Date  . BREAST BIOPSY Right   . BREAST CYST ASPIRATION Left   . BREAST SURGERY Right    biopsy - benign  . COLONOSCOPY    . LIPOMA EXCISION Left    6 cm tumor  . MASS EXCISION Left 05/08/2015   Procedure: EXCISION OF LEFT DELTOID MASS;  Surgeon: [name], MD;  Location: [place];  Service: General;  Laterality: Left;  . NASAL SEPTUM SURGERY    . TONSILLECTOMY    . urethral stricture surgery     at age 40    Family History  Problem Relation Age of Onset  . Obesity Mother   . High Cholesterol Mother   . Arthritis  Mother   . Hyperlipidemia Mother   . Hypertension Mother   . High Cholesterol Father   . Hypothyroidism Father   . Prostate cancer Father   . Hyperlipidemia Father   . Hypertension Father   . Prostate cancer Maternal Uncle   . Colon cancer Maternal Uncle   . Breast cancer Paternal Aunt   . Arthritis Maternal Grandmother   . Stroke Maternal Grandmother   . Hypertension Maternal Grandmother   . Mental illness Maternal Grandmother   .  Hypertension Maternal Grandfather   . Hyperlipidemia Paternal Grandmother   . Stroke Paternal Grandmother   . Prostate cancer Paternal Grandfather   . Hyperlipidemia Paternal Grandfather   . Heart disease Paternal Grandfather   . Hypertension Paternal Grandfather   . Prostate cancer Maternal Uncle   . Breast cancer Paternal Aunt   . Diabetes Paternal Uncle     No Known Allergies  Current Outpatient Medications on File Prior to Visit  Medication Sig Dispense Refill  . atorvastatin (LIPITOR) 40 MG tablet Take 1 tablet (40 mg total) by mouth daily at 6 PM. 30 tablet 1  . Cholecalciferol (VITAMIN D3) 5000 UNITS TABS Take 5,000 Units by mouth once a week.    . clopidogrel (PLAVIX) 75 MG tablet Take 1 tablet (75 mg total) by mouth daily. 30 tablet 11  . escitalopram (LEXAPRO) 10 MG tablet TAKE 1 TABLET BY MOUTH DAILY. (Patient taking differently: Take 10 mg by mouth daily. ) 90 tablet 1  . valACYclovir (VALTREX) 1000 MG tablet TAKE 2 TABLETS BY MOUTH TWICE DAILY FOR 1 DAY AS NEEDED FOR BREAKOUTS. (Patient taking differently: Take 2,000 mg by mouth 2 (two) times daily as needed (for breakouts). Take 2 tablets by mouth twice daily for 1 day as needed for breakouts.) 30 tablet 3  . vitamin B-12 (CYANOCOBALAMIN) 500 MCG tablet Take 500 mcg by mouth once a week.      No current facility-administered medications on file prior to visit.     BP 104/64   Pulse (!) 54   Temp 98.2 F (36.8 C) (Oral)   Ht 5\' 5"  (1.651 m)   Wt 136 lb (61.7 kg)   LMP 09/01/2018    SpO2 96%   BMI 22.63 kg/m    Objective:   Physical Exam  Constitutional: She appears well-nourished.  Neck: Neck supple. Carotid bruit is not present.  Cardiovascular: Normal rate and regular rhythm.  Palpable cord like mass to left AC of left upper extremity. No erythema or tenderness.  Respiratory: Effort normal and breath sounds normal.  Musculoskeletal:       Cervical back: She exhibits normal range of motion, no tenderness, no bony tenderness and no pain.  5/5 strength to bilateral upper extremities. Grips equal and strong.   Skin: Skin is warm and dry.  Psychiatric: She has a normal mood and affect.           Assessment & Plan:

## 2018-09-12 NOTE — Assessment & Plan Note (Signed)
Atorvastatin 40 mg initiated during hospital stay. Continue same. Repeat lipids in 6 weeks.

## 2018-09-12 NOTE — Assessment & Plan Note (Signed)
Doing well on Lexapro 10 mg, could not tolerate Buspar. Continue with Lexapro 10 mg. She seems to be doing much better overall.

## 2018-09-13 ENCOUNTER — Encounter: Payer: Self-pay | Admitting: Primary Care

## 2018-09-13 ENCOUNTER — Other Ambulatory Visit: Payer: Self-pay | Admitting: Primary Care

## 2018-09-13 DIAGNOSIS — R200 Anesthesia of skin: Secondary | ICD-10-CM

## 2018-09-19 ENCOUNTER — Ambulatory Visit: Payer: 59 | Attending: Primary Care

## 2018-09-19 DIAGNOSIS — M5412 Radiculopathy, cervical region: Secondary | ICD-10-CM | POA: Diagnosis not present

## 2018-09-19 DIAGNOSIS — M7989 Other specified soft tissue disorders: Secondary | ICD-10-CM | POA: Insufficient documentation

## 2018-09-19 NOTE — Therapy (Signed)
[facility] [address] [place], [address] Phone: [phone]   Fax:  [phone]  Physical Therapy Evaluation  Patient Details  Name: Jessica Dodson MRN: [id number] Date of Birth: November 27, 1965 Referring Provider (PT): Alma Friendly, NP   Encounter Date: 09/19/2018  PT End of Session - 09/19/18 1647    Visit Number  1    Number of Visits  13    Date for PT Re-Evaluation  11/01/18    PT Start Time  3151    PT Stop Time  1747    PT Time Calculation (min)  60 min    Activity Tolerance  Patient tolerated treatment well    Behavior During Therapy  [facility] for tasks assessed/performed       Past Medical History:  Diagnosis Date  . ADHD (attention deficit hyperactivity disorder)   . Allergy   . Cold sore   . Complication of anesthesia    hard to wake up after tonsillectomy and nasoseptoplasty  . Depression    recurrent, not on meds at this time  . Depression   . H/O alcohol abuse    19 years sober  . Headache    migraine (once a year) usually has an aura  . Migraine with aura   . Pneumonia 2016    Past Surgical History:  Procedure Laterality Date  . BREAST BIOPSY Right   . BREAST CYST ASPIRATION Left   . BREAST SURGERY Right    biopsy - benign  . COLONOSCOPY    . LIPOMA EXCISION Left    6 cm tumor  . MASS EXCISION Left 05/08/2015   Procedure: EXCISION OF LEFT DELTOID MASS;  Surgeon: [name], MD;  Location: [place];  Service: General;  Laterality: Left;  . NASAL SEPTUM SURGERY    . TONSILLECTOMY    . urethral stricture surgery     at age 66    There were no vitals filed for this visit.   Subjective Assessment - 09/19/18 1649    Subjective  No pain. L UE numbness: 1/10 currently.     Pertinent History  L UE numbness. Pt felt numbness L UE when driving. Sudden onset. Went to the ER, had imaging.  L UE is still numb but getting better. 90 % back.  No neck pain. No arm pain currently.  Felt  a deep ache L UE from her collar bone to her L arm when her symptoms were waking up (Sunday a week ago).  Feels more numbness L lateral arm (C5/6 dermatome) compared to L medial arm (C8/T1 dermatome).  No neck pain or numbness.   R hand dominant.  Has a neurology appointment January 2020.  L UE numbness has gotten 75% better after her initial hospital visit 09/04/2018). L UE symptoms currently 90 % better.  Better able to use the weed eater and carry a cup of coffee.   Pt states that she has never lost strength.     Patient Stated Goals  Does not want to loose strength.     Currently in Pain?  No/denies    Pain Score  0-No pain    Pain Location  --   UE    Pain Orientation  Left    Pain Type  Acute pain    Pain Radiating Towards  L UE to hand    Pain Onset  1 to 4 weeks ago    Pain Frequency  Occasional  Aggravating Factors   none    Pain Relieving Factors  none         [facility] PT Assessment - 09/19/18 1700      Assessment   Medical Diagnosis  L UE numbness    Referring Provider (PT)  Alma Friendly, NP    Onset Date/Surgical Date  09/03/18    Hand Dominance  Right      Precautions   Precaution Comments  no known precautions      Restrictions   Other Position/Activity Restrictions  no known restrictions      Prior Function   Vocation  Full time employment    Leisure  running marathons      Observation/Other Assessments   Observations  (-) Roos test      Posture/Postural Control   Posture Comments  Slight R cervical side bend, slight cervical protraction, R shoulder protraction, R shoulder higher, L shoulder lower, R scapular winging.       AROM   Overall AROM Comments  Full functional B UE AROM all planes    Cervical Flexion  WFL    Cervical Extension  WFL with L digits 1,2,3 tingling with over pressure   reproduction of symptoms with overpressure   Cervical - Right Side Bend  WFL    Cervical - Left Side Bend  WFL    Cervical - Right Rotation  full    Cervical - Left  Rotation  full      Strength   Overall Strength Comments  Grip: L: 60 lbs, 54 lbs, 58 lbs (57 lbs average) ; R 73 lbs, 69 lbs, 74 lbs    Right Shoulder Flexion  4+/5    Right Shoulder ABduction  5/5    Right Shoulder Internal Rotation  5/5    Right Shoulder External Rotation  5/5    Left Shoulder Flexion  5/5    Left Shoulder ABduction  5/5    Left Shoulder Internal Rotation  5/5    Left Shoulder External Rotation  5/5    Right Elbow Flexion  5/5    Right Elbow Extension  5/5    Left Elbow Flexion  5/5    Left Elbow Extension  5/5    Right Wrist Extension  4+/5    Left Wrist Extension  4+/5      Palpation   Palpation comment  L C5 transverse process more palpable. L cervical paraspinal muscle tension, L UT tension > R. Decreased caudal glide L first rib.                Objective measurements completed on examination: See above findings.   Manual therapy  Caudal glide L first rib grade 3. Decreased neck tightness felt.   Slight improved sensation to L C8/T1 arm dermatome afterwards  Gentle manual cervical traction. Decreased L digits 1,2,3 paresthesia           Patient is a 52 year old female who came to physical therapy secondary to  L UE numbness. She also presents with altered posture, B upper trap muscle tightness, reproduction of symptoms (L digit 1,2,3 tingling) with overpressure to cervical extension AROM, decreased L hand paresthesia with gentle manual cervical traction, decreased L medial arm paresthesia with promoting inferior glide to L first rib to decrease L first rib stiffness and pressure to L ulnar nerve. Pt will benefit from skilled physical therapy services to improve L UE sensation as well as grip strength to promote ability to perform functional tasks.  PT Education - 09/19/18 1928    Education Details  L UE dermatome, possible cervical, first rib, and scalene muscle involvement    Person(s) Educated  Patient    Methods  Explanation     Comprehension  Verbalized understanding       PT Short Term Goals - 09/19/18 1857      PT SHORT TERM GOAL #1   Title  Pt will be independent with her HEP to promote L UE sensation.     Time  3    Period  Weeks    Status  New    Target Date  10/11/18        PT Long Term Goals - 09/19/18 1858      PT LONG TERM GOAL #1   Title  Patient will report 100 % improvement of L UE numbness to promote ability to perform functional tasks with L UE more easily.     Baseline  Pt states 90% improvement of L UE numbness since onset (09/19/2018)    Time  6    Period  Weeks    Status  New    Target Date  11/01/18      PT LONG TERM GOAL #2   Title  Pt will improve L grip strength by at least 5 lbs to promote ability to hold and carry items and perform functioal tasks with L UE.     Baseline  L grip strength 57 lbs average (09/19/2018)    Time  6    Period  Weeks    Status  New    Target Date  11/01/18             Plan - 09/19/18 1853    Clinical Impression Statement  Patient is a 52 year old female who came to physical therapy secondary to  L UE numbness. She also presents with altered posture, B upper trap muscle tightness, reproduction of symptoms (L digit 1,2,3 tingling) with overpressure to cervical extension AROM, decreased L hand paresthesia with gentle manual cervical traction, decreased L medial arm paresthesia with promoting inferior glide to L first rib to decrease L first rib stiffness and pressure to L ulnar nerve. Pt will benefit from skilled physical therapy services to improve L UE sensation as well as grip strength to promote ability to perform functional tasks.     History and Personal Factors relevant to plan of care:  L UE numbness. Athletic, fit, good husband support, motivated.     Clinical Presentation  Stable    Clinical Presentation due to:  Pt states that her L UE symptoms are better since onset.     Clinical Decision Making  Low    Rehab Potential  Good     Clinical Impairments Affecting Rehab Potential  (+) motivated, athletic, fit, young age    PT Frequency  2x / week    PT Duration  6 weeks    PT Treatment/Interventions  Therapeutic activities;Therapeutic exercise;Neuromuscular re-education;Patient/family education;Manual techniques;Dry needling;Aquatic Therapy;Electrical Stimulation;Iontophoresis 4mg /ml Dexamethasone;Traction    PT Next Visit Plan  gentle manual cervical traction, STM to decrease muscle tension, scapular and cervical strengthening, manual techniques, modalities PRN      Consulted and Agree with Plan of Care  Patient       Patient will benefit from skilled therapeutic intervention in order to improve the following deficits and impairments:  Postural dysfunction, Impaired UE functional use  Visit Diagnosis: Other specified soft tissue disorders - Plan: PT plan of care  cert/re-cert  Radiculopathy, cervical region - Plan: PT plan of care cert/re-cert     Problem List Patient Active Problem List   Diagnosis Date Noted  . Left upper extremity numbness 09/12/2018  . Hyperlipidemia LDL goal <70 09/12/2018  . Superficial vein thrombosis 09/12/2018  . TIA (transient ischemic attack) 09/03/2018  . ADHD 12/07/2016  . Essential tremor 07/21/2016  . Herpes labialis 02/24/2016  . Preventative health care 10/20/2015  . Breast mass in female 10/12/2015  . Anxiety and depression 06/30/2015   Joneen Boers PT, DPT   09/19/2018, 7:33 PM  [facility] [address] [place], [address] Phone: [phone]   Fax:  [phone]  Name: Jessica Dodson MRN: [id number] Date of Birth: 14-Feb-1966

## 2018-09-26 ENCOUNTER — Ambulatory Visit: Payer: 59

## 2018-10-10 ENCOUNTER — Telehealth: Payer: Self-pay

## 2018-10-10 ENCOUNTER — Ambulatory Visit: Payer: 59 | Attending: Primary Care

## 2018-10-10 DIAGNOSIS — M7989 Other specified soft tissue disorders: Secondary | ICD-10-CM | POA: Insufficient documentation

## 2018-10-10 DIAGNOSIS — M5412 Radiculopathy, cervical region: Secondary | ICD-10-CM | POA: Insufficient documentation

## 2018-10-10 NOTE — Telephone Encounter (Signed)
Called pt to check up on her to see how she is doing. Pt states that she is doing much better after last session. Practically back to normal. Only 5% of symptoms left in her index and ring finger as well as at her ulnar side of her arm.   Provided L first rib stretch with cervical rotation over the phone 10x3 using belt/strap as well as recommended for pt to perform scapular strengthening exercises targeting the lower trap muscle. Pt verbalized understanding.

## 2018-10-24 ENCOUNTER — Other Ambulatory Visit (INDEPENDENT_AMBULATORY_CARE_PROVIDER_SITE_OTHER): Payer: 59

## 2018-10-24 ENCOUNTER — Ambulatory Visit: Payer: 59

## 2018-10-24 DIAGNOSIS — E785 Hyperlipidemia, unspecified: Secondary | ICD-10-CM

## 2018-10-24 DIAGNOSIS — M7989 Other specified soft tissue disorders: Secondary | ICD-10-CM | POA: Diagnosis not present

## 2018-10-24 DIAGNOSIS — M5412 Radiculopathy, cervical region: Secondary | ICD-10-CM | POA: Diagnosis not present

## 2018-10-24 LAB — HEPATIC FUNCTION PANEL
ALBUMIN: 4.6 g/dL (ref 3.5–5.2)
ALT: 16 U/L (ref 0–35)
AST: 21 U/L (ref 0–37)
Alkaline Phosphatase: 50 U/L (ref 39–117)
Bilirubin, Direct: 0.2 mg/dL (ref 0.0–0.3)
TOTAL PROTEIN: 7.4 g/dL (ref 6.0–8.3)
Total Bilirubin: 0.9 mg/dL (ref 0.2–1.2)

## 2018-10-24 LAB — LIPID PANEL
Cholesterol: 117 mg/dL (ref 0–200)
HDL: 54.7 mg/dL (ref >39.00)
LDL Cholesterol: 51 mg/dL (ref 0–99)
NONHDL: 62.11
TRIGLYCERIDES: 57 mg/dL (ref 0.0–149.0)
Total CHOL/HDL Ratio: 2
VLDL: 11.4 mg/dL (ref 0.0–40.0)

## 2018-10-24 NOTE — Therapy (Signed)
[facility] [address], [place], [address] Phone: [phone]   Fax:  [phone]  Physical Therapy Treatment And Discharge Summary  Patient Details  Name: Jessica Dodson MRN: [id number] Date of Birth: 01-02-1966 Referring Provider (PT): Alma Friendly, NP   Encounter Date: 10/24/2018  PT End of Session - 10/24/18 1604    Visit Number  2    Number of Visits  13    Date for PT Re-Evaluation  11/01/18    PT Start Time  1604    PT Stop Time  1703    PT Time Calculation (min)  59 min    Activity Tolerance  Patient tolerated treatment well    Behavior During Therapy  [facility] for tasks assessed/performed       Past Medical History:  Diagnosis Date  . ADHD (attention deficit hyperactivity disorder)   . Allergy   . Cold sore   . Complication of anesthesia    hard to wake up after tonsillectomy and nasoseptoplasty  . Depression    recurrent, not on meds at this time  . Depression   . H/O alcohol abuse    19 years sober  . Headache    migraine (once a year) usually has an aura  . Migraine with aura   . Pneumonia 2016    Past Surgical History:  Procedure Laterality Date  . BREAST BIOPSY Right   . BREAST CYST ASPIRATION Left   . BREAST SURGERY Right    biopsy - benign  . COLONOSCOPY    . LIPOMA EXCISION Left    6 cm tumor  . MASS EXCISION Left 05/08/2015   Procedure: EXCISION OF LEFT DELTOID MASS;  Surgeon: [name], MD;  Location: [facility];  Service: General;  Laterality: Left;  . NASAL SEPTUM SURGERY    . TONSILLECTOMY    . urethral stricture surgery     at age 89    There were no vitals filed for this visit.  Subjective Assessment - 10/24/18 1605    Subjective  Saw that her ergonomics were not good. Still has symptoms L pinky and ring finger (5% numbess). The thumb, index, and middle finger are back to normal.  Still has residual numbness L UE ulnar side.  Sleeps on her L side and wakes up  cold and tense.     Pertinent History  L UE numbness. Pt felt numbness L UE when driving. Sudden onset. Went to the ER, had imaging.  L UE is still numb but getting better. 90 % back.  No neck pain. No arm pain currently.  Felt a deep ache L UE from her collar bone to her L arm when her symptoms were waking up (Sunday a week ago).  Feels more numbness L lateral arm (C5/6 dermatome) compared to L medial arm (C8/T1 dermatome).  No neck pain or numbness.   R hand dominant.  Has a neurology appointment January 2020.  L UE numbness has gotten 75% better after her initial hospital visit 09/04/2018). L UE symptoms currently 90 % better.  Better able to use the weed eater and carry a cup of coffee.   Pt states that she has never lost strength.     Patient Stated Goals  Does not want to loose strength.     Currently in Pain?  No/denies    Pain Score  0-No pain    Pain Onset  1 to 4 weeks ago  PT Education - 10/24/18 1634    Education Details  ther-ex, HEP    Person(s) Educated  Patient    Methods  Explanation;Demonstration;Tactile cues;Verbal cues;Handout    Comprehension  Verbalized understanding;Returned demonstration        Objective   95% improved L UE paresthesia  Medbridge Access Code: [email or web address]   No latex band allergies   Therapeutic exercise  L first rib stretch with cervical rotatoin 10x3 each rotation  No change in L pinky symptoms  Seated press ups 10x5 seconds for 2 sets   Supine with head on deflated ball  Cervical nodding 1 minute x 2  Cervical rotatoin 1 minute x 2  L first rib stretch with R cervical side bend 30 seconds x 4  Increased L digit 4 and 5 healthy tingling/decreased numbness.  Scapular retraction green band 10x5 seconds for 2 sets with emphasis on lower trap muscle use to help decrease upper trap use.   L grip strength: 65 lbs, 63 lbs, 64 lbs (64 lbs average)    Improved exercise technique, movement  at target joints, use of target muscles after min to mod verbal, visual, tactile cues.      Manual therapy  Caudal glide L first rib grade 3.              improved sensation to L C8/T1 arm dermatome and digits 4 and 5 afterwards  Improved L first rib mobility    Improved L ulnar nerve distribution sensation following treatment to decrease L scalene muscle tension and improving caudal glide mobility to L first rib. Pt has demonstrated overall improved L UE sensation and improved L grip strength since initial evaluation and has made good progress towards goals. Skilled physical therapy services discharged with patient continuing progress with her exercises at home.        PT Short Term Goals - 10/24/18 1842      PT SHORT TERM GOAL #1   Title  Pt will be independent with her HEP to promote L UE sensation.     Baseline  Pt independent with her HEP provided today (10/24/2018)    Time  3    Period  Weeks    Status  Achieved    Target Date  10/11/18        PT Long Term Goals - 10/24/18 1842      PT LONG TERM GOAL #1   Title  Patient will report 100 % improvement of L UE numbness to promote ability to perform functional tasks with L UE more easily.     Baseline  Pt states 90% improvement of L UE numbness since onset (09/19/2018); 95% improvement (10/24/2018)    Time  6    Period  Weeks    Status  Partially Met    Target Date  11/01/18      PT LONG TERM GOAL #2   Title  Pt will improve L grip strength by at least 5 lbs to promote ability to hold and carry items and perform functioal tasks with L UE.     Baseline  L grip strength 57 lbs average (09/19/2018); 64 lbs average (10/24/2018)    Time  6    Period  Weeks    Status  Achieved    Target Date  11/01/18            Plan - 10/24/18 1635    Clinical Impression Statement  Improved L ulnar nerve distribution sensation following treatment to decrease L scalene  muscle tension and improving caudal glide mobility to L first  rib. Pt has demonstrated overall improved L UE sensation and improved L grip strength since initial evaluation and has made good progress towards goals. Skilled physical therapy services discharged with patient continuing progress with her exercises at home.     History and Personal Factors relevant to plan of care:  athletic, fit, good husband support, motivated    Clinical Presentation  Stable    Clinical Presentation due to:  pt made good progress towards goals    Clinical Decision Making  Low    Rehab Potential  Good    Clinical Impairments Affecting Rehab Potential  (+) motivated, athletic, fit, young age    PT Frequency  --    PT Duration  --    PT Treatment/Interventions  Therapeutic activities;Therapeutic exercise;Neuromuscular re-education;Patient/family education;Traction;Manual techniques    PT Next Visit Plan  continue progress with her exercises at home    Consulted and Agree with Plan of Care  Patient       Patient will benefit from skilled therapeutic intervention in order to improve the following deficits and impairments:  Postural dysfunction, Impaired UE functional use  Visit Diagnosis: Other specified soft tissue disorders  Radiculopathy, cervical region     Problem List Patient Active Problem List   Diagnosis Date Noted  . Left upper extremity numbness 09/12/2018  . Hyperlipidemia LDL goal <70 09/12/2018  . Superficial vein thrombosis 09/12/2018  . TIA (transient ischemic attack) 09/03/2018  . ADHD 12/07/2016  . Essential tremor 07/21/2016  . Herpes labialis 02/24/2016  . Preventative health care 10/20/2015  . Breast mass in female 10/12/2015  . Anxiety and depression 06/30/2015    Thank you for your referral.  Joneen Boers PT, DPT   10/24/2018, 6:48 PM  [facility] [address] [place], [address] Phone: [phone]   Fax:  [phone]  Name: NAKIAH OSGOOD MRN:  [id number] Date of Birth: 1966-05-25

## 2018-10-24 NOTE — Patient Instructions (Addendum)
  Medbridge Access Code: [email or web address]   Seated Shoulder Press Ups Off Table  10x3 with 5 second holds   Scapular Retraction with Resistance  Green band 10x3 with 5 second holds    Also gave L first rib stretch with R cervical side bend 30 seconds x 3 as part of her HEP. Handout provided. Pt demonstrated and verbalized understanding. (instead of performing the cervical rotations)

## 2018-11-12 ENCOUNTER — Ambulatory Visit (INDEPENDENT_AMBULATORY_CARE_PROVIDER_SITE_OTHER): Payer: 59 | Admitting: Primary Care

## 2018-11-12 VITALS — BP 100/68 | HR 58 | Temp 98.2°F | Ht 65.0 in | Wt 131.2 lb

## 2018-11-12 DIAGNOSIS — E785 Hyperlipidemia, unspecified: Secondary | ICD-10-CM | POA: Diagnosis not present

## 2018-11-12 DIAGNOSIS — R21 Rash and other nonspecific skin eruption: Secondary | ICD-10-CM

## 2018-11-12 HISTORY — DX: Rash and other nonspecific skin eruption: R21

## 2018-11-12 MED ORDER — PERMETHRIN 5 % EX CREA: TOPICAL_CREAM | CUTANEOUS | 0 refills | Status: DC

## 2018-11-12 MED ORDER — CLOBETASOL PROPIONATE 0.05 % EX CREA: 1.0000 "application " | TOPICAL_CREAM | Freq: Two times a day (BID) | CUTANEOUS | 0 refills | Status: DC

## 2018-11-12 NOTE — Assessment & Plan Note (Signed)
Odd presentation but does represent scabies. Rx for Permethrin cream sent to pharmacy for treatment. Rx for clobetasol cream sent to pharmacy for itching. She will update if no improvement in 48 hours.

## 2018-11-12 NOTE — Patient Instructions (Addendum)
Apply 30 grams (halft tube) from the head to the soles of the feet. Leave on for 8-14 hours then shower off.  Use the clobetasol cream twice daily as needed for itching/rash.  Please update me if no improvement within 48 hours.  It was a pleasure to see you today!

## 2018-11-12 NOTE — Assessment & Plan Note (Signed)
Atorvastatin 40 mg caused significant myalgias, so she switched to 20 mg soon after initiation. Recent lipid panel with LDL of 55 on 20 mg dose. Continue same.

## 2018-11-12 NOTE — Progress Notes (Signed)
Subjective:    Patient ID: Jessica Dodson, female    DOB: 04/21/1966, 53 y.o.   MRN: [id number]  HPI  Jessica Dodson is a 53 year old female who presents today with a chief complaint of rash.  Her rash is located to the left upper extremity mostly, several spots on the mid and left posterior back, and right upper extremity. Her rash is itchy. She first noticed her symptoms one week ago after hugging one of her patient's form whom she diagnosed with scabies.   She denies changes in soaps, detergents, medications. She's not been in the woods. No one else in her household is itching.  Review of Systems  Respiratory: Negative for shortness of breath and wheezing.   Musculoskeletal: Negative for myalgias.  Skin: Positive for rash.       Past Medical History:  Diagnosis Date  . ADHD (attention deficit hyperactivity disorder)   . Allergy   . Cold sore   . Complication of anesthesia    hard to wake up after tonsillectomy and nasoseptoplasty  . Depression    recurrent, not on meds at this time  . Depression   . H/O alcohol abuse    19 years sober  . Headache    migraine (once a year) usually has an aura  . Migraine with aura   . Pneumonia 2016     Social History   Socioeconomic History  . Marital status: Married    Spouse name: Not on file  . Number of children: 0  . Years of education: MD  . Highest education level: Not on file  Occupational History  . Not on file  Social Needs  . Financial resource strain: Not on file  . Food insecurity:    Worry: Not on file    Inability: Not on file  . Transportation needs:    Medical: Not on file    Non-medical: Not on file  Tobacco Use  . Smoking status: Never Smoker  . Smokeless tobacco: Never Used  Substance and Sexual Activity  . Alcohol use: Never    Alcohol/week: 0.0 standard drinks    Frequency: Never    Comment: sober for 23 years  . Drug use: No  . Sexual activity: Not on file  Lifestyle  . Physical activity:    Days  per week: Not on file    Minutes per session: Not on file  . Stress: Not on file  Relationships  . Social connections:    Talks on phone: Not on file    Gets together: Not on file    Attends religious service: Not on file    Active member of club or organization: Not on file    Attends meetings of clubs or organizations: Not on file    Relationship status: Not on file  . Intimate partner violence:    Fear of current or ex partner: Not on file    Emotionally abused: Not on file    Physically abused: Not on file    Forced sexual activity: Not on file  Other Topics Concern  . Not on file  Social History Narrative   ** Merged History Encounter **       ** Data from: 05/12/15 Enc Dept: [facility]       ** Data from: 06/30/15 Enc Dept: [facility]   Married. 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  Live in [place].  Enjoys running, mountain bike, kayak.  Past Surgical History:  Procedure Laterality Date  . BREAST BIOPSY Right   . BREAST CYST ASPIRATION Left   . BREAST SURGERY Right    biopsy - benign  . COLONOSCOPY    . LIPOMA EXCISION Left    6 cm tumor  . MASS EXCISION Left 05/08/2015   Procedure: EXCISION OF LEFT DELTOID MASS;  Surgeon: [name], MD;  Location: [facility];  Service: General;  Laterality: Left;  . NASAL SEPTUM SURGERY    . TONSILLECTOMY    . urethral stricture surgery     at age 64    Family History  Problem Relation Age of Onset  . Obesity Mother   . High Cholesterol Mother   . Arthritis Mother   . Hyperlipidemia Mother   . Hypertension Mother   . High Cholesterol Father   . Hypothyroidism Father   . Prostate cancer Father   . Hyperlipidemia Father   . Hypertension Father   . Prostate cancer Maternal Uncle   . Colon cancer Maternal Uncle   . Breast cancer Paternal Aunt   . Arthritis Maternal Grandmother   . Stroke Maternal Grandmother   . Hypertension Maternal Grandmother   . Mental illness Maternal  Grandmother   . Hypertension Maternal Grandfather   . Hyperlipidemia Paternal Grandmother   . Stroke Paternal Grandmother   . Prostate cancer Paternal Grandfather   . Hyperlipidemia Paternal Grandfather   . Heart disease Paternal Grandfather   . Hypertension Paternal Grandfather   . Prostate cancer Maternal Uncle   . Breast cancer Paternal Aunt   . Diabetes Paternal Uncle     No Known Allergies  Current Outpatient Medications on File Prior to Visit  Medication Sig Dispense Refill  . atorvastatin (LIPITOR) 40 MG tablet Take 1 tablet (40 mg total) by mouth daily at 6 PM. (Patient taking differently: Take 20 mg by mouth daily at 6 PM. ) 30 tablet 1  . Cholecalciferol (VITAMIN D3) 5000 UNITS TABS Take 5,000 Units by mouth once a week.    . clopidogrel (PLAVIX) 75 MG tablet Take 1 tablet (75 mg total) by mouth daily. 30 tablet 11  . escitalopram (LEXAPRO) 10 MG tablet TAKE 1 TABLET BY MOUTH DAILY. (Patient taking differently: Take 10 mg by mouth daily. ) 90 tablet 1  . valACYclovir (VALTREX) 1000 MG tablet TAKE 2 TABLETS BY MOUTH TWICE DAILY FOR 1 DAY AS NEEDED FOR BREAKOUTS. (Patient taking differently: Take 2,000 mg by mouth 2 (two) times daily as needed (for breakouts). Take 2 tablets by mouth twice daily for 1 day as needed for breakouts.) 30 tablet 3  . vitamin B-12 (CYANOCOBALAMIN) 500 MCG tablet Take 500 mcg by mouth once a week.      No current facility-administered medications on file prior to visit.     BP 100/68   Pulse (!) 58   Temp 98.2 F (36.8 C) (Oral)   Ht 5\' 5"  (1.651 m)   Wt 131 lb 4 oz (59.5 kg)   SpO2 99%   BMI 21.84 kg/m    Objective:   Physical Exam  Constitutional: She appears well-nourished.  Cardiovascular: Normal rate and regular rhythm.  Respiratory: Effort normal and breath sounds normal. She has no wheezes.  Skin: Skin is warm and dry. Rash noted.  Raised rash with scabbing from itching to left posterior lower portion of upper extremity. Small  spots to right posterior extremity and mid upper back. No specific dermatome.  Assessment & Plan:

## 2018-11-21 ENCOUNTER — Ambulatory Visit (INDEPENDENT_AMBULATORY_CARE_PROVIDER_SITE_OTHER): Payer: 59 | Admitting: Vascular Surgery

## 2018-11-21 ENCOUNTER — Other Ambulatory Visit: Payer: Self-pay

## 2018-11-21 ENCOUNTER — Encounter: Payer: Self-pay | Admitting: Vascular Surgery

## 2018-11-21 VITALS — BP 126/76 | HR 51 | Temp 97.1°F | Resp 16 | Ht 65.0 in | Wt 131.0 lb

## 2018-11-21 DIAGNOSIS — I6522 Occlusion and stenosis of left carotid artery: Secondary | ICD-10-CM | POA: Diagnosis not present

## 2018-11-21 NOTE — Progress Notes (Signed)
REASON FOR CONSULT:    Carotid stenosis.  The consult was requested by Alma Friendly, NP.  HPI:   Jessica Dodson is a pleasant 53 y.o. female, who on October 28 was driving and noticed some paresthesias along her left upper extremity.  She went to the emergency department had an IV placed in her left arm and was worked up for possible TIA.  This included an MRI of the neck which suggested that her symptoms may be related to cervical disc disease.  Part of her work-up included a carotid duplex scan which did not show any significant carotid stenosis however there was some mild plaque in the left carotid artery.  She was sent for vascular consultation.  Patient denies any previous history of stroke, TIAs, expressive or receptive aphasia, or amaurosis fugax.  She has no real risk factors for peripheral vascular disease.  She has no history of diabetes, hypertension, hypercholesterolemia, family history of premature cardiovascular disease or tobacco use.  She was on aspirin prior to this event but was placed on Plavix and the aspirin was discontinued pending evaluation by neurology.  She is also on Lipitor now as her cholesterol was just mildly elevated.  Past Medical History:  Diagnosis Date  . ADHD (attention deficit hyperactivity disorder)   . Allergy   . Cold sore   . Complication of anesthesia    hard to wake up after tonsillectomy and nasoseptoplasty  . Depression    recurrent, not on meds at this time  . Depression   . H/O alcohol abuse    19 years sober  . Headache    migraine (once a year) usually has an aura  . Migraine with aura   . Pneumonia 2016    Family History  Problem Relation Age of Onset  . Obesity Mother   . High Cholesterol Mother   . Arthritis Mother   . Hyperlipidemia Mother   . Hypertension Mother   . High Cholesterol Father   . Hypothyroidism Father   . Prostate cancer Father   . Hyperlipidemia Father   . Hypertension Father   . Prostate cancer  Maternal Uncle   . Colon cancer Maternal Uncle   . Breast cancer Paternal Aunt   . Arthritis Maternal Grandmother   . Stroke Maternal Grandmother   . Hypertension Maternal Grandmother   . Mental illness Maternal Grandmother   . Hypertension Maternal Grandfather   . Hyperlipidemia Paternal Grandmother   . Stroke Paternal Grandmother   . Prostate cancer Paternal Grandfather   . Hyperlipidemia Paternal Grandfather   . Heart disease Paternal Grandfather   . Hypertension Paternal Grandfather   . Prostate cancer Maternal Uncle   . Breast cancer Paternal Aunt   . Diabetes Paternal Uncle     SOCIAL HISTORY: Social History   Socioeconomic History  . Marital status: Married    Spouse name: Not on file  . Number of children: 0  . Years of education: MD  . Highest education level: Not on file  Occupational History  . Not on file  Social Needs  . Financial resource strain: Not on file  . Food insecurity:    Worry: Not on file    Inability: Not on file  . Transportation needs:    Medical: Not on file    Non-medical: Not on file  Tobacco Use  . Smoking status: Never Smoker  . Smokeless tobacco: Never Used  Substance and Sexual Activity  . Alcohol use: Never  Alcohol/week: 0.0 standard drinks    Frequency: Never    Comment: sober for 23 years  . Drug use: No  . Sexual activity: Not on file  Lifestyle  . Physical activity:    Days per week: Not on file    Minutes per session: Not on file  . Stress: Not on file  Relationships  . Social connections:    Talks on phone: Not on file    Gets together: Not on file    Attends religious service: Not on file    Active member of club or organization: Not on file    Attends meetings of clubs or organizations: Not on file    Relationship status: Not on file  . Intimate partner violence:    Fear of current or ex partner: Not on file    Emotionally abused: Not on file    Physically abused: Not on file    Forced sexual activity: Not  on file  Other Topics Concern  . Not on file  Social History Narrative   ** Merged History Encounter **       ** Data from: 05/12/15 Enc Dept: [facility]       ** Data from: 06/30/15 Enc Dept: [facility]   Married. 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  Enjoys running, mountain bike, kayak.     No Known Allergies  Current Outpatient Medications  Medication Sig Dispense Refill  . atorvastatin (LIPITOR) 40 MG tablet Take 1 tablet (40 mg total) by mouth daily at 6 PM. (Patient taking differently: Take 20 mg by mouth daily at 6 PM. ) 30 tablet 1  . Cholecalciferol (VITAMIN D3) 5000 UNITS TABS Take 5,000 Units by mouth once a week.    . clobetasol cream (TEMOVATE) 9.24 % Apply 1 application topically 2 (two) times daily. 30 g 0  . clopidogrel (PLAVIX) 75 MG tablet Take 1 tablet (75 mg total) by mouth daily. 30 tablet 11  . escitalopram (LEXAPRO) 10 MG tablet TAKE 1 TABLET BY MOUTH DAILY. (Patient taking differently: Take 10 mg by mouth daily. ) 90 tablet 1  . permethrin (ELIMITE) 5 % cream Apply to entire body from the head to the soles of feet once. Leave on for 8-14 hours then shower. 60 g 0  . vitamin B-12 (CYANOCOBALAMIN) 500 MCG tablet Take 500 mcg by mouth once a week.     . valACYclovir (VALTREX) 1000 MG tablet TAKE 2 TABLETS BY MOUTH TWICE DAILY FOR 1 DAY AS NEEDED FOR BREAKOUTS. (Patient not taking: Take 2 tablets by mouth twice daily for 1 day as needed for breakouts.) 30 tablet 3   No current facility-administered medications for this visit.     REVIEW OF SYSTEMS:  [X]  denotes positive finding, [ ]  denotes negative finding Cardiac  Comments:  Chest pain or chest pressure:    Shortness of breath upon exertion:    Short of breath when lying flat:    Irregular heart rhythm:        Vascular    Pain in calf, thigh, or hip brought on by ambulation:    Pain in feet at night that wakes you up from your sleep:     Blood  clot in your veins:    Leg swelling:         Pulmonary    Oxygen at home:    Productive cough:     Wheezing:  Neurologic    Sudden weakness in arms or legs:     Sudden numbness in arms or legs:     Sudden onset of difficulty speaking or slurred speech:    Temporary loss of vision in one eye:     Problems with dizziness:         Gastrointestinal    Blood in stool:     Vomited blood:         Genitourinary    Burning when urinating:     Blood in urine:        Psychiatric    Major depression:         Hematologic    Bleeding problems:    Problems with blood clotting too easily:        Skin    Rashes or ulcers: x       Constitutional    Fever or chills:     PHYSICAL EXAM:   Vitals:   11/21/18 1525 11/21/18 1534  BP: 128/76 126/76  Pulse: (!) 51 (!) 51  Resp: 16   Temp: (!) 97.1 F (36.2 C)   TempSrc: Oral   SpO2: 100%   Weight: 131 lb (59.4 kg)   Height: 5\' 5"  (1.651 m)     GENERAL: The patient is a well-nourished female, in no acute distress. The vital signs are documented above. CARDIAC: There is a regular rate and rhythm.  VASCULAR: I do not detect carotid bruits. She has palpable popliteal dorsalis pedis and posterior tibial pulses bilaterally. She has no significant lower extremity swelling. PULMONARY: There is good air exchange bilaterally without wheezing or rales. ABDOMEN: Soft and non-tender with normal pitched bowel sounds.  I do not palpate an abdominal aortic aneurysm. MUSCULOSKELETAL: There are no major deformities or cyanosis. NEUROLOGIC: No focal weakness or paresthesias are detected. SKIN: There are no ulcers or rashes noted. PSYCHIATRIC: The patient has a normal affect.  DATA:    CAROTID DUPLEX: I have reviewed the carotid duplex scan that was done at the outlying hospital on 09/04/2018.  On the right side there was no significant carotid stenosis.  On the left side there was no significant carotid stenosis.  Both vertebral arteries  were patent with antegrade flow.  ASSESSMENT & PLAN:   MILD PLAQUE LEFT INTERNAL CAROTID ARTERY: This patient had a carotid duplex scan to work-up some transient paresthesias in the left arm which is now felt to be related to cervical disc disease.  The carotid duplex scan showed some mild plaque at the origin of the left internal carotid artery.  There was no significant stenosis noted.  The patient has no risk factors for peripheral vascular disease and leads a very healthy lifestyle.  She does not smoke.  She is a vegan.  She is very active and exercises.  Thus I think her risk for progression of any plaque is very small.  I do not think routine follow-up is needed.  I be happy to see her back at any time if any new vascular issues arise.  I would agree that she can go back on aspirin and does not need Plavix pending her evaluation by neurology.  Deitra Mayo [facility] [phone]

## 2018-12-05 ENCOUNTER — Ambulatory Visit (INDEPENDENT_AMBULATORY_CARE_PROVIDER_SITE_OTHER): Payer: 59 | Admitting: Neurology

## 2018-12-05 ENCOUNTER — Encounter: Payer: Self-pay | Admitting: Neurology

## 2018-12-05 ENCOUNTER — Other Ambulatory Visit: Payer: Self-pay

## 2018-12-05 VITALS — BP 133/74 | HR 53 | Ht 65.0 in | Wt 130.0 lb

## 2018-12-05 DIAGNOSIS — R29818 Other symptoms and signs involving the nervous system: Secondary | ICD-10-CM | POA: Diagnosis not present

## 2018-12-05 DIAGNOSIS — G250 Essential tremor: Secondary | ICD-10-CM | POA: Diagnosis not present

## 2018-12-05 NOTE — Progress Notes (Signed)
Subjective:    Patient ID: Jessica Dodson is a 53 y.o. female.  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 The patient is unaccompanied today. She presents after a long gap of over 2 years. I first met her on 08/24/2016, at which time she reported a long-standing history of bilateral upper extremity tremors of over 20 years duration. She was advised to start Mysoline and low dose with gradual increase.  Today, 12/05/2018: She reports having had an episode of left arm numbness recently. She was driving to work when she noticed that her left arm became numb. Of note, she was admitted to [facility] on 09/03/2018 for her symptoms. She had extensive stroke workup. Stroke workup showed no acute stroke. She has recently seen a vascular surgeon for a concern for mild left carotid plaque. She was advised to start Plavix. She had a brain MRI without contrast on 09/03/2018 and I reviewed the results: IMPRESSION:  Negative exam. No infarct or other explanation for left arm symptoms.   Echocardiogram on 09/04/2018 showed benign results, EF was 55-60%.  Carotid Doppler ultrasound on 09/04/2018 showed: IMPRESSION: 1. Minimal amount of left-sided atherosclerotic plaque, not resulting in a hemodynamically significant stenosis. 2. Normal sonographic evaluation of the right carotid system.  She had a venous duplex ultrasound of the upper extremities on 09/11/2018 and I reviewed the results: Summary: Right: No evidence of thrombosis in the subclavian. Left: No evidence of deep vein thrombosis in the upper extremity. No evidence of thrombosis in the subclavian. Findings  consistent with acute superficial vein thrombosis involving the left basilic vein. See comments listed above.  She also had a neck MRI in October 2019: IMPRESSION: 1. No significant disc protrusions, spinal or foraminal stenosis. 2. Mild left foraminal encroachment at C7-T1 but no stenosis. 3. Normal appearance of the cervical spinal cord.  She had stopped taking the prednisone because of side effects, she does not feel well with it. She is a runner, she continues with her exercise regimen, she continues to be vegan. She had started Lipitor in October and had a recent recheck in her lipid profile which showed significant reduction in her LDL. It was not very high to begin with, around 108 before, currently 59. She tolerates the low dose generic Lipitor and is willing to continue. She is questioning whether she needs Plavix. Of note, she had been taking a baby aspirin before and was switched to Plavix in October. She is wondering if she would benefit from taking a adult size aspirin instead. She had been taking the aspirin for several reasons including inflammation secondary to running and prevention with a family history of colon cancer/precancerous polyps. She tolerates the Plavix, especially, no bleeding or bruising reported.   She continues to have occasional migraines, typically around her menstrual period, on average once a month. She takes over-the-counter Tylenol and Motrin for this as needed.   Her hand tremor is stable.  Her left arm numbness improved, she has some residual numbness in her left fifth digit, she had some physical therapy which she believes has helped.  The patient's allergies, current medications, family history, past medical history, past social history, past surgical history and problem  list were reviewed and updated as appropriate.   Previously:   08/24/2016: (She) reports a long-standing history of bilateral upper extremity tremors for years, since her 82s, most  noticible in med school.  4 years her tremors were minimal and not noticeable. In the past couple of years she has noted progression. In particular, she has difficulty doing some fine motor tasks including removing sutures or moles. Tremor is a little bit worse on the right than left, handwriting is not significantly affected.  I reviewed your office note from 07/20/2016. She is currently on Lexapro, which she recently reduced to 10 mg once daily. She's working through her mood disorder including her anxiety with counseling/anxiety classes.  Her parents are elderly and ailing and she has been caring for them.   She lives with her husband, has no children, is a runner, no alcohol (since she had the issue with dependence for about 1 year, while in medschool, got treatment). Drinks caffeine, about 3 cups per day. No sodas, eats vegan. Has started meditation and relaxation, which seems to help.  Has not noted Any significant worsening of her tremors after exercise.  She also has a history of migraines, these are thankfully infrequent. She has visual auras. She has never tried triptans and would like to avoid triptans. She has no family history of a central tremor, she has a family history of alcohol abuse. Migraine frequency is currently about once per month. She misses about 5 days of work a year secondary to migraines but would like to start taking supplements for prevention rather than prescription medication. She tries to hydrate well.    Her Past Medical History Is Significant For: Past Medical History:  Diagnosis Date  . ADHD (attention deficit hyperactivity disorder)   . Allergy   . Cold sore   . Complication of anesthesia    hard to wake up after tonsillectomy and nasoseptoplasty  . Depression    recurrent, not on meds at this time  . Depression   . H/O alcohol abuse    19 years sober  . Headache    migraine (once a year) usually has an aura  . Migraine with aura   . Pneumonia 2016     Her Past Surgical History Is Significant For: Past Surgical History:  Procedure Laterality Date  . BREAST BIOPSY Right   . BREAST CYST ASPIRATION Left   . BREAST SURGERY Right    biopsy - benign  . COLONOSCOPY    . LIPOMA EXCISION Left    6 cm tumor  . MASS EXCISION Left 05/08/2015   Procedure: EXCISION OF LEFT DELTOID MASS;  Surgeon: [name], MD;  Location: [facility];  Service: General;  Laterality: Left;  . NASAL SEPTUM SURGERY    . TONSILLECTOMY    . urethral stricture surgery     at age 64    Her Family History Is Significant For: Family History  Problem Relation Age of Onset  . Obesity Mother   . High Cholesterol Mother   . Arthritis Mother   . Hyperlipidemia Mother   . Hypertension Mother   . High Cholesterol Father   . Hypothyroidism Father   . Prostate cancer Father   . Hyperlipidemia Father   . Hypertension Father   . Prostate cancer Maternal Uncle   . Colon cancer Maternal Uncle   . Breast cancer Paternal Aunt   . Arthritis Maternal Grandmother   . Stroke Maternal Grandmother   . Hypertension Maternal Grandmother   .  Mental illness Maternal Grandmother   . Hypertension Maternal Grandfather   . Hyperlipidemia Paternal Grandmother   . Stroke Paternal Grandmother   . Prostate cancer Paternal Grandfather   . Hyperlipidemia Paternal Grandfather   . Heart disease Paternal Grandfather   . Hypertension Paternal Grandfather   . Prostate cancer Maternal Uncle   . Breast cancer Paternal Aunt   . Diabetes Paternal Uncle     Her Social History Is Significant For: Social History   Socioeconomic History  . Marital status: Married    Spouse name: Not on file  . Number of children: 0  . Years of education: MD  . Highest education level: Not on file  Occupational History  . Not on file  Social Needs  . Financial resource strain: Not on file  . Food insecurity:    Worry: Not on file    Inability: Not on file  . Transportation needs:    Medical: Not on  file    Non-medical: Not on file  Tobacco Use  . Smoking status: Never Smoker  . Smokeless tobacco: Never Used  Substance and Sexual Activity  . Alcohol use: Never    Alcohol/week: 0.0 standard drinks    Frequency: Never    Comment: sober for 23 years  . Drug use: No  . Sexual activity: Not on file  Lifestyle  . Physical activity:    Days per week: Not on file    Minutes per session: Not on file  . Stress: Not on file  Relationships  . Social connections:    Talks on phone: Not on file    Gets together: Not on file    Attends religious service: Not on file    Active member of club or organization: Not on file    Attends meetings of clubs or organizations: Not on file    Relationship status: Not on file  Other Topics Concern  . 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  Live in [place].  Enjoys running, mountain bike, kayak.     Her Allergies Are:  No Known Allergies:   Her Current Medications Are:  Outpatient Encounter Medications as of 12/05/2018  Medication Sig  . Cholecalciferol (VITAMIN D3) 5000 UNITS TABS Take 5,000 Units by mouth once a week.  . clopidogrel (PLAVIX) 75 MG tablet Take 1 tablet (75 mg total) by mouth daily.  Marland Kitchen escitalopram (LEXAPRO) 10 MG tablet TAKE 1 TABLET BY MOUTH DAILY. (Patient taking differently: Take 10 mg by mouth daily. )  . valACYclovir (VALTREX) 1000 MG tablet TAKE 2 TABLETS BY MOUTH TWICE DAILY FOR 1 DAY AS NEEDED FOR BREAKOUTS.  . vitamin B-12 (CYANOCOBALAMIN) 500 MCG tablet Take 500 mcg by mouth once a week.   . [DISCONTINUED] atorvastatin (LIPITOR) 40 MG tablet Take 1 tablet (40 mg total) by mouth daily at 6 PM. (Patient taking differently: Take 20 mg by mouth daily at 6 PM. )  . [DISCONTINUED] clobetasol cream  (TEMOVATE) 0.45 % Apply 1 application topically 2 (two) times daily.  . [DISCONTINUED] permethrin (ELIMITE) 5 % cream Apply to entire body from the head to the soles of feet once. Leave on for 8-14 hours then shower.   No facility-administered encounter medications  on file as of 12/05/2018.   :  Review of Systems:  Out of a complete 14 point review of systems, all are reviewed and negative with the exception of these symptoms as listed below: Review of Systems  Neurological:       Pt presents today to discuss her recent ER visit for a possible stroke. Pt is wondering if she should continue lipitor and plavix. Pt stopped the primidone for her tremor.    Objective:  Neurological Exam  Physical Exam Physical Examination:   Vitals:   12/05/18 1459  BP: 133/74  Pulse: (!) 53    General Examination: The patient is a very pleasant 53 y.o. female in no acute distress. She appears well-developed and well-nourished and well groomed.   HEENT: Normocephalic, atraumatic, pupils are equal, round and reactive to light and accommodation. Extraocular tracking is good without limitation to gaze excursion or nystagmus noted. Normal smooth pursuit is noted. Hearing is grossly intact. Face is symmetric with normal facial animation and normal facial sensation. Speech is clear with no dysarthria noted. There is no hypophonia. There is no lip, neck/head, jaw or voice tremor. Neck shows FROM. Oropharynx exam reveals: benign.  Chest: Clear to auscultation without wheezing, rhonchi or crackles noted.  Heart: S1+S2+0, regular and normal without murmurs, rubs or gallops noted.   Abdomen: Soft, non-tender and non-distended with normal bowel sounds appreciated on auscultation.  Extremities: There is no pitting edema in the distal lower extremities bilaterally.   Skin: Warm and dry without trophic changes noted. There are no varicose veins.  Musculoskeletal: exam reveals no obvious joint deformities,  tenderness or joint swelling or erythema.   Neurologically:  Mental status: The patient is awake, alert and oriented in all 4 spheres. Her immediate and remote memory, attention, language skills and fund of knowledge are appropriate. There is no evidence of aphasia, agnosia, apraxia or anomia. Speech is clear with normal prosody and enunciation. Thought process is linear. Mood is normal and affect is normal.  Cranial nerves II - XII are as described above under HEENT exam.  Motor exam: Normal bulk, strength and tone is noted. She has no obvious resting tremor, she has a bilateral upper extremity postural and action tremor, right side mildly worse than left, no intention tremor, stable.  (On 08/24/2016: Archimedes spiral drawing shows very mild tremulousness bilaterally, handwriting is legible, not tremulous, no micrographia is noted.)  Romberg is negative. Reflexes are 2+ throughout. Fine motor skills and coordination: grossly intact.  Cerebellar testing: No dysmetria or intention. There is no truncal or gait ataxia.  Sensory exam: intact to light touch.  Gait, station and balance: She stands easily. No veering to one side is noted. No leaning to one side is noted. Posture is age-appropriate and stance is narrow based. Gait shows normal stride length and normal pace. No problems turning are noted. Tandem walk is unremarkable.  Assessment and Plan:   In summary, KAIYA BOATMAN is a very pleasant 53 year old female with an underlying medical history of allergies, mild depression, anxiety, ADHD, (remote history of alcohol abuse), bradycardia, migraine headaches, and history of pneumonia, who presents for follow-up consultation of her hand tremors. In addition, she had a recent episode of left arm numbness, had a full TIA/stroke workup through [facility], which I reviewed. Workup was benign. She saw vascular surgeon for a suspected left carotid plaque and no further testing or follow-up  was recommended from the surgeon's standpoint. Symptoms have evolved in that she  improved, but reports some residual numbness in the ulnar aspect of her forearm and digit 5 on the left side, this would argue more towards a peripheral issue, she does have some neck discomfort and had physical therapy for this which helped. She has a healthy lifestyle, she has no significant stroke risk factors. Nevertheless, her LDL has responded nicely to low-dose Lipitor, therefore suggested she continue with it. Furthermore, she may want to switch over from Plavix to regular size aspirin rather than baby aspirin. She was taking baby aspirin for a few different reasons in the past successfully, she can take aspirin 325 mg daily and stop the Plavix, especially since she has a very favorable stroke risk factor profile and may benefit from the aspirin better. She is in agreement. She is in regular follow-up at least on a yearly basis with her primary care nurse practitioner, she is encouraged to maintain her healthy lifestyle. She is a nonsmoker and does not currently drink any alcohol. Her exam is benign. She has a stable hand tremor, in keeping with her long-standing history of essential tremor. She was not able to tolerate Mysoline. Beta blocker is not a good choice for her given her bradycardia. I suggested as needed follow-up with me. I answered all her questions today and she was in agreement. I spent 25 minutes in total face-to-face time with the patient, more than 50% of which was spent in counseling and coordination of care, reviewing test results, reviewing medication and discussing or reviewing the diagnosis of ET, transient neurological Sx, the prognosis and treatment options. Pertinent laboratory and imaging test results that were available during this visit with the patient were reviewed by me and considered in my medical decision making (see chart for details).

## 2018-12-05 NOTE — Patient Instructions (Signed)
Please switch over to regular aspirin 325 mg daily and you may stop the Plavix.  Please follow up with your primary care routinely, I would recommend you continue with the low dose Lipitor, as your LDL has responded nicely.  You look well!  Stay healthy, follow up prn.

## 2018-12-10 ENCOUNTER — Encounter: Payer: Self-pay | Admitting: Primary Care

## 2018-12-10 ENCOUNTER — Ambulatory Visit (INDEPENDENT_AMBULATORY_CARE_PROVIDER_SITE_OTHER): Payer: Commercial Managed Care - PPO | Admitting: Primary Care

## 2018-12-10 VITALS — BP 126/60 | HR 53 | Temp 98.0°F | Ht 65.0 in | Wt 129.5 lb

## 2018-12-10 DIAGNOSIS — N939 Abnormal uterine and vaginal bleeding, unspecified: Secondary | ICD-10-CM

## 2018-12-10 NOTE — Patient Instructions (Signed)
Stop by the front desk and speak with either Rosaria Ferries or Shirlean Mylar regarding your ultrasound.   I will be in touch once I receive your vaginal swab results.   It was a pleasure to see you today! Best of luck with your upcoming marathon!

## 2018-12-10 NOTE — Progress Notes (Signed)
Subjective:    Patient ID: Jessica Dodson, female    DOB: 14-Jan-1966, 53 y.o.   MRN: [id number]  HPI  Jessica Dodson is a very pleasant 53 year old female who presents today with a chief complaint of abnormal uterine bleeding.  She endorses intermittent vaginal spotting that has been present since September 2018. Spotting will occur monthly on average and is dark brown, occurs sporidically in between menstrual cycles. Also with regular menstrual cycles which will last for 5-7 days on average with a short gap of amenorrhea in between days. This is her norm.   Her mother had a history of these symptoms that began at age 67, was found to have a left ovarian cyst at that time. Symptoms continued and at age 30 her mother was diagnosed with  focal hyperplasia and underwent hysterectomy. Her last menstrual period was on 11/24/18. She denies vaginal discharge, vaginal itching, hot flashes, abdominal pain, bloating, hematuria, urinary symptoms.  Review of Systems  Gastrointestinal: Negative for abdominal pain.  Genitourinary: Negative for dysuria, frequency, hematuria, pelvic pain, urgency and vaginal discharge.       Abnormal uterine bleeding        Past Medical History:  Diagnosis Date  . ADHD (attention deficit hyperactivity disorder)   . Allergy   . Cold sore   . Complication of anesthesia    hard to wake up after tonsillectomy and nasoseptoplasty  . Depression    recurrent, not on meds at this time  . Depression   . H/O alcohol abuse    19 years sober  . Headache    migraine (once a year) usually has an aura  . Migraine with aura   . Pneumonia 2016     Social History   Socioeconomic History  . Marital status: Married    Spouse name: Not on file  . Number of children: 0  . Years of education: MD  . Highest education level: Not on file  Occupational History  . Not on file  Social Needs  . Financial resource strain: Not on file  . Food insecurity:    Worry: Not on file   Inability: Not on file  . Transportation needs:    Medical: Not on file    Non-medical: Not on file  Tobacco Use  . Smoking status: Never Smoker  . Smokeless tobacco: Never Used  Substance and Sexual Activity  . Alcohol use: Never    Alcohol/week: 0.0 standard drinks    Frequency: Never    Comment: sober for 23 years  . Drug use: No  . Sexual activity: Not on file  Lifestyle  . Physical activity:    Days per week: Not on file    Minutes per session: Not on file  . Stress: Not on file  Relationships  . Social connections:    Talks on phone: Not on file    Gets together: Not on file    Attends religious service: Not on file    Active member of club or organization: Not on file    Attends meetings of clubs or organizations: Not on file    Relationship status: Not on file  . Intimate partner violence:    Fear of current or ex partner: Not on file    Emotionally abused: Not on file    Physically abused: Not on file    Forced sexual activity: Not on file  Other Topics Concern  . 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  Enjoys running, mountain bike, kayak.     Past Surgical History:  Procedure Laterality Date  . BREAST BIOPSY Right   . BREAST CYST ASPIRATION Left   . BREAST SURGERY Right    biopsy - benign  . COLONOSCOPY    . LIPOMA EXCISION Left    6 cm tumor  . MASS EXCISION Left 05/08/2015   Procedure: EXCISION OF LEFT DELTOID MASS;  Surgeon: [name], MD;  Location: [facility];  Service: General;  Laterality: Left;  . NASAL SEPTUM SURGERY    . TONSILLECTOMY    . urethral stricture surgery     at age 42    Family History  Problem Relation Age of Onset  . Obesity Mother   . High Cholesterol Mother   . Arthritis Mother   .  Hyperlipidemia Mother   . Hypertension Mother   . High Cholesterol Father   . Hypothyroidism Father   . Prostate cancer Father   . Hyperlipidemia Father   . Hypertension Father   . Prostate cancer Maternal Uncle   . Colon cancer Maternal Uncle   . Breast cancer Paternal Aunt   . Arthritis Maternal Grandmother   . Stroke Maternal Grandmother   . Hypertension Maternal Grandmother   . Mental illness Maternal Grandmother   . Hypertension Maternal Grandfather   . Hyperlipidemia Paternal Grandmother   . Stroke Paternal Grandmother   . Prostate cancer Paternal Grandfather   . Hyperlipidemia Paternal Grandfather   . Heart disease Paternal Grandfather   . Hypertension Paternal Grandfather   . Prostate cancer Maternal Uncle   . Breast cancer Paternal Aunt   . Diabetes Paternal Uncle     No Known Allergies  Current Outpatient Medications on File Prior to Visit  Medication Sig Dispense Refill  . aspirin 325 MG tablet Take 325 mg by mouth daily.    . Cholecalciferol (VITAMIN D3) 5000 UNITS TABS Take 5,000 Units by mouth once a week.    . escitalopram (LEXAPRO) 10 MG tablet TAKE 1 TABLET BY MOUTH DAILY. (Patient taking differently: Take 10 mg by mouth daily. ) 90 tablet 1  . valACYclovir (VALTREX) 1000 MG tablet TAKE 2 TABLETS BY MOUTH TWICE DAILY FOR 1 DAY AS NEEDED FOR BREAKOUTS. 30 tablet 3  . vitamin B-12 (CYANOCOBALAMIN) 500 MCG tablet Take 500 mcg by mouth once a week.      No current facility-administered medications on file prior to visit.     BP 126/60   Pulse (!) 53   Temp 98 F (36.7 C) (Oral)   Ht 5\' 5"  (1.651 m)   Wt 129 lb 8 oz (58.7 kg)   LMP 11/24/2018   SpO2 99%   BMI 21.55 kg/m    Objective:   Physical Exam  Constitutional: She appears well-nourished.  Cardiovascular: Regular rhythm.  Sinus bradycardia  Respiratory: Effort normal and breath sounds normal.  GI: Soft. Bowel sounds are normal. There is no abdominal tenderness.  Genitourinary: There is no  tenderness or lesion on the right labia. There is no tenderness or lesion on the left labia. Cervix exhibits discharge. Cervix exhibits no motion tenderness. Right adnexum displays no tenderness. Left adnexum displays no tenderness.    Vaginal  discharge present.     No vaginal erythema.  No erythema in the vagina.    Genitourinary Comments: Scant amount of dark brown discharge   Skin: Skin is warm and dry.           Assessment & Plan:

## 2018-12-10 NOTE — Assessment & Plan Note (Signed)
Chronic since September 2018, monthly. Also with regular menstrual cycles. Doesn't seem to be perimenopause given presence of regular cycles. Vaginal exam with evidence of spotting, overall unremarkable otherwise. Given family history and chronicity of symptoms, will send for pelvic and transvaginal ultrasound. Wet prep completed today. Patient declines STD testing. Await results.

## 2018-12-11 LAB — WET PREP BY MOLECULAR PROBE
Candida species: NOT DETECTED
Gardnerella vaginalis: NOT DETECTED
MICRO NUMBER: 141726
SPECIMEN QUALITY: ADEQUATE
Trichomonas vaginosis: NOT DETECTED

## 2018-12-12 ENCOUNTER — Ambulatory Visit
Admission: RE | Admit: 2018-12-12 | Discharge: 2018-12-12 | Disposition: A | Discharge status: Home / Self Care | Payer: Commercial Managed Care - PPO | Source: Ambulatory Visit | Attending: Primary Care | Admitting: Primary Care

## 2018-12-12 DIAGNOSIS — D252 Subserosal leiomyoma of uterus: Secondary | ICD-10-CM | POA: Diagnosis not present

## 2018-12-12 DIAGNOSIS — N939 Abnormal uterine and vaginal bleeding, unspecified: Secondary | ICD-10-CM

## 2018-12-14 ENCOUNTER — Encounter: Payer: Self-pay | Admitting: Obstetrics and Gynecology

## 2018-12-19 ENCOUNTER — Ambulatory Visit (INDEPENDENT_AMBULATORY_CARE_PROVIDER_SITE_OTHER): Payer: 59 | Admitting: Obstetrics and Gynecology

## 2018-12-19 ENCOUNTER — Encounter: Payer: Self-pay | Admitting: Obstetrics and Gynecology

## 2018-12-19 ENCOUNTER — Other Ambulatory Visit: Payer: Self-pay

## 2018-12-19 VITALS — BP 130/76 | HR 50 | Ht 65.0 in | Wt 130.4 lb

## 2018-12-19 DIAGNOSIS — D219 Benign neoplasm of connective and other soft tissue, unspecified: Secondary | ICD-10-CM | POA: Diagnosis not present

## 2018-12-19 DIAGNOSIS — N939 Abnormal uterine and vaginal bleeding, unspecified: Secondary | ICD-10-CM | POA: Diagnosis not present

## 2018-12-19 NOTE — Progress Notes (Signed)
53 y.o. G0P0. Married Caucasian female here for abnormal uterine bleeding.    Having irregular spotting, brown, since Sept. 2018.  Not every day.  Can occur 5 - 8 days per month, outside of cycle time.  No pain, no bloating.  No energy issues.   [place] 12/12/18 showed fibroids.  3.3 x 2.5 x 3.0 cm upper uterine segment subserosal fibroid, extending to submucosal. 1.2 x 1.0 x 1.1 cm posterior intramural fibroid. 1.5 x 0.9 x 1.8 cm LUS calcifications which may be degenerated fibroid. EMS 8 mm.  Ovaries normal. No free fluid.   Does not like hormonal medication.   Catholic and does not prevent pregnancy.   Runner.   PCP: Alma Friendly, NP     Patient's last menstrual period was 12/12/2018 (exact date).     Period Cycle (Days): 30 Period Duration (Days): 5-7 days Period Pattern: Regular Menstrual Flow: Moderate(2 days mid cycle has no bleeding, then resums) Menstrual Control: Tampon, Maxi pad Menstrual Control Change Freq (Hours): changes every 5-6 hours on heaviest day Dysmenorrhea: None     Sexually active: Yes.    The current method of family planning is none.    Exercising: Yes.    running, weights and cardio. Smoker:  no  Health Maintenance: Pap:  01-17-18 Neg History of abnormal Pap:  no MMG: 02-28-18 3D Neg/density D/BiRads1 Colonoscopy: 01-20-18 Neg--colonoscopy age 71 normal BMD:   n/a  Result  n/a TDaP:  06-09-17 Gardasil:   no HIV: 09-04-17 NR Hep C:no Screening Labs:  ----   reports that she has never smoked. She has never used smokeless tobacco. She reports that she does not drink alcohol or use drugs.  Past Medical History:  Diagnosis Date  . Abnormal uterine bleeding   . ADHD (attention deficit hyperactivity disorder)   . Allergy   . Anxiety   . Cold sore   . Complication of anesthesia    hard to wake up after tonsillectomy and nasoseptoplasty  . Depression    recurrent, not on meds at this time  . Depression   . Fibroid   . H/O alcohol abuse    19  years sober  . Headache    migraine (once a year) usually has an aura  . Migraine with aura   . Pneumonia 2016  . Urinary incontinence     Past Surgical History:  Procedure Laterality Date  . BREAST BIOPSY Right   . BREAST CYST ASPIRATION Left   . BREAST SURGERY Right    biopsy - benign  . COLONOSCOPY    . LIPOMA EXCISION Left    6 cm tumor  . MASS EXCISION Left 05/08/2015   Procedure: EXCISION OF LEFT DELTOID MASS;  Surgeon: [name], MD;  Location: [facility];  Service: General;  Laterality: Left;  . NASAL SEPTUM SURGERY    . nevus removed     4 dysplastic nevi removed  . TONSILLECTOMY    . urethral stricture surgery     at age 57    Current Outpatient Medications  Medication Sig Dispense Refill  . aspirin 325 MG tablet Take 325 mg by mouth daily.    Marland Kitchen atorvastatin (LIPITOR) 10 MG tablet Take 10 mg by mouth daily.    . Cholecalciferol (VITAMIN D3) 5000 UNITS TABS Take 5,000 Units by mouth once a week.    . escitalopram (LEXAPRO) 10 MG tablet TAKE 1 TABLET BY MOUTH DAILY. (Patient taking differently: Take 10 mg by mouth daily. ) 90 tablet 1  .  valACYclovir (VALTREX) 1000 MG tablet TAKE 2 TABLETS BY MOUTH TWICE DAILY FOR 1 DAY AS NEEDED FOR BREAKOUTS. 30 tablet 3  . vitamin B-12 (CYANOCOBALAMIN) 500 MCG tablet Take 500 mcg by mouth once a week.      No current facility-administered medications for this visit.     Family History  Problem Relation Age of Onset  . Obesity Mother   . High Cholesterol Mother   . Arthritis Mother   . Hyperlipidemia Mother   . Hypertension Mother   . High Cholesterol Father   . Hypothyroidism Father   . Prostate cancer Father   . Hyperlipidemia Father   . Hypertension Father   . Cancer Father 55       Prostate ca  . Prostate cancer Maternal Uncle   . Colon cancer Maternal Uncle   . Breast cancer Paternal Aunt   . Arthritis Maternal Grandmother   . Stroke Maternal Grandmother   . Hypertension Maternal Grandmother   . Mental illness  Maternal Grandmother   . Hypertension Maternal Grandfather   . Hyperlipidemia Paternal Grandmother   . Stroke Paternal Grandmother   . Prostate cancer Paternal Grandfather   . Hyperlipidemia Paternal Grandfather   . Heart disease Paternal Grandfather   . Hypertension Paternal Grandfather   . Hypothyroidism Paternal Grandfather   . Prostate cancer Maternal Uncle   . Breast cancer Paternal Aunt   . Diabetes Paternal Uncle     Review of Systems  All other systems reviewed and are negative.   Exam:   BP 130/76 (BP Location: Right Arm, Patient Position: Sitting, Cuff Size: Normal)   Pulse (!) 50   Ht 5\' 5"  (1.651 m)   Wt 130 lb 6.4 oz (59.1 kg)   LMP 12/12/2018 (Exact Date)   HC 14" (35.6 cm)   BMI 21.70 kg/m     General appearance: alert, cooperative and appears stated age Head: Normocephalic, without obvious abnormality, atraumatic Neck: no adenopathy, supple, symmetrical, trachea midline and thyroid normal to inspection and palpation Lungs: clear to auscultation bilaterally Heart: regular rate and rhythm Abdomen: soft, non-tender; no masses, no organomegaly Skin: Skin color, texture, turgor normal. No rashes or lesions Lymph nodes: Cervical, supraclavicular, and inguinal nodes normal Neurologic: Grossly normal  Pelvic: External genitalia:  no lesions              Urethra:  normal appearing urethra with no masses, tenderness or lesions              Bartholins and Skenes: normal                 Vagina: normal appearing vagina with normal color and discharge, no lesions              Cervix: no lesions.  Small os.            Bimanual Exam:  Uterus:  normal size, contour, position, consistency, mobility, non-tender              Adnexa: no mass, fullness, tenderness     Pelvic US image in Epic reviewed with patient.    Chaperone was present for exam.  Assessment:    Abnormal uterine bleeding.  Fibroids.  Possible intracavitary.  Hx migraine with aura.  Hx possible TIA.    Off Plavix.   Plan:  We discussed abnormal uterine bleeding and potential etiologies - fibroids, polyps, anovulation, hyperplasia, and malignancy.  Will check TSH,  Return for sonohysterogram and EMB.  Procedures and rationale reviewed.  Questions invited and answered.  After visit summary provided.   ___30____ minutes face to face time of which over 50% was spent in counseling.

## 2018-12-20 ENCOUNTER — Telehealth: Payer: Self-pay | Admitting: Obstetrics and Gynecology

## 2018-12-20 LAB — TSH: TSH: 1.3 u[IU]/mL (ref 0.450–4.500)

## 2018-12-20 NOTE — Telephone Encounter (Signed)
Spoke with patient regarding benefit for a sonohysterogram with an endometrial biopsy. Patient agreeable and ready to proceed with scheduling. Patient is scheduled 01/17/19 with Dr Quincy Simmonds. Patient is aware of the appointment date, arrival time and cancellation policy. Patient has also been instructed to call at the onset of her cycle to confirm appointment is scheduled with in the proper time frame of her cycle.  Routing to Dr Quincy Simmonds for final review. Patient is agreeable to disposition. Will close encounter

## 2019-01-14 ENCOUNTER — Encounter: Payer: Self-pay | Admitting: Obstetrics and Gynecology

## 2019-01-15 ENCOUNTER — Telehealth: Payer: Self-pay | Admitting: Obstetrics and Gynecology

## 2019-01-15 NOTE — Telephone Encounter (Signed)
Responded to patient via mychart.    It will be okay to be on your cycle for the procedures you have planned.  I will send your information to Dr. Quincy Simmonds as well so she is aware.  Please let us know if you have any questions and we will see you on the 12th!  Sincerely,  Edrick Kins, BSN RN-BC Triage Nurse [facility]

## 2019-01-15 NOTE — Telephone Encounter (Signed)
Patient sent the following correspondence through [facility]. Routing to triage to assist patient with request.  Greetings. I think I started my period today (January 14, 2019), but it seems very light. Your staff asked me to notify you when I start to make sure my cycle coincides well with the timing of the [place] and EMB (scheduled for January 17, 2019).     Oddly, the only time I had any discoloration this past month was on Feb 24th when I suspect I shed my cervical plug. No other spotting.     Thank you,  Jessica Dodson   Last seen: 12/19/18

## 2019-01-17 ENCOUNTER — Ambulatory Visit (INDEPENDENT_AMBULATORY_CARE_PROVIDER_SITE_OTHER): Payer: 59

## 2019-01-17 ENCOUNTER — Ambulatory Visit (INDEPENDENT_AMBULATORY_CARE_PROVIDER_SITE_OTHER): Payer: 59 | Admitting: Obstetrics and Gynecology

## 2019-01-17 ENCOUNTER — Other Ambulatory Visit: Payer: Self-pay

## 2019-01-17 ENCOUNTER — Encounter: Payer: Self-pay | Admitting: Obstetrics and Gynecology

## 2019-01-17 ENCOUNTER — Other Ambulatory Visit: Payer: Self-pay | Admitting: Obstetrics and Gynecology

## 2019-01-17 VITALS — BP 120/68 | HR 72 | Resp 14 | Ht 65.0 in | Wt 133.6 lb

## 2019-01-17 DIAGNOSIS — D219 Benign neoplasm of connective and other soft tissue, unspecified: Secondary | ICD-10-CM

## 2019-01-17 DIAGNOSIS — N939 Abnormal uterine and vaginal bleeding, unspecified: Secondary | ICD-10-CM

## 2019-01-17 DIAGNOSIS — N9489 Other specified conditions associated with female genital organs and menstrual cycle: Secondary | ICD-10-CM

## 2019-01-17 DIAGNOSIS — N840 Polyp of corpus uteri: Secondary | ICD-10-CM | POA: Diagnosis not present

## 2019-01-17 NOTE — Progress Notes (Signed)
Encounter reviewed by Dr. Brook Amundson C. Silva.  

## 2019-01-17 NOTE — Patient Instructions (Signed)

## 2019-01-17 NOTE — Progress Notes (Signed)
GYNECOLOGY  VISIT   HPI: 53 y.o.   Married  Caucasian  female   No obstetric history on file. with Patient's last menstrual period was 01/14/2019.   here for  sonohysterogram and endometrial biopsy for abnormal uterine bleeding.   Prior US showed fibroids and possible submucous fibroid.   Had spotting with loss of mucous plug this month.  Irregular spotting since Sept. 2018.   Doing a marathon 02/03/19 in [place].  GYNECOLOGIC HISTORY: Patient's last menstrual period was 01/14/2019. Contraception:  None. Menopausal hormone therapy:  NA Last mammogram:  02-28-18 3D Neg/density D/BiRads1 Last pap smear:   01-17-18 Neg.  No hx of abnormal pap.        OB History    Gravida  0   Para  0   Term  0   Preterm  0   AB  0   Living        SAB  0   TAB  0   Ectopic  0   Multiple      Live Births                 Patient Active Problem List   Diagnosis Date Noted  . Abnormal uterine bleeding 12/10/2018  . Rash and nonspecific skin eruption 11/12/2018  . Left upper extremity numbness 09/12/2018  . Hyperlipidemia LDL goal <70 09/12/2018  . Superficial vein thrombosis 09/12/2018  . TIA (transient ischemic attack) 09/03/2018  . ADHD 12/07/2016  . Essential tremor 07/21/2016  . Herpes labialis 02/24/2016  . Preventative health care 10/20/2015  . Breast mass in female 10/12/2015  . Anxiety and depression 06/30/2015    Past Medical History:  Diagnosis Date  . Abnormal uterine bleeding   . ADHD (attention deficit hyperactivity disorder)   . Allergy   . Anxiety   . Cold sore   . Complication of anesthesia    hard to wake up after tonsillectomy and nasoseptoplasty  . Depression    recurrent, not on meds at this time  . Depression   . Fibroid   . H/O alcohol abuse    19 years sober  . Headache    migraine (once a year) usually has an aura  . Migraine with aura   . Pneumonia 2016  . Urinary incontinence     Past Surgical History:  Procedure Laterality  Date  . BREAST BIOPSY Right   . BREAST CYST ASPIRATION Left   . BREAST SURGERY Right    biopsy - benign  . COLONOSCOPY    . LIPOMA EXCISION Left    6 cm tumor  . MASS EXCISION Left 05/08/2015   Procedure: EXCISION OF LEFT DELTOID MASS;  Surgeon: [name], MD;  Location: [facility];  Service: General;  Laterality: Left;  . NASAL SEPTUM SURGERY    . nevus removed     4 dysplastic nevi removed  . TONSILLECTOMY    . urethral stricture surgery     at age 42    Current Outpatient Medications  Medication Sig Dispense Refill  . aspirin 325 MG tablet Take 325 mg by mouth daily.    Marland Kitchen atorvastatin (LIPITOR) 10 MG tablet Take 10 mg by mouth daily.    . Cholecalciferol (VITAMIN D3) 5000 UNITS TABS Take 5,000 Units by mouth once a week.    . escitalopram (LEXAPRO) 10 MG tablet TAKE 1 TABLET BY MOUTH DAILY. (Patient taking differently: Take 10 mg by mouth daily. ) 90 tablet 1  . valACYclovir (VALTREX) 1000 MG  tablet TAKE 2 TABLETS BY MOUTH TWICE DAILY FOR 1 DAY AS NEEDED FOR BREAKOUTS. 30 tablet 3  . vitamin B-12 (CYANOCOBALAMIN) 500 MCG tablet Take 500 mcg by mouth once a week.      No current facility-administered medications for this visit.      ALLERGIES: Patient has no known allergies.  Family History  Problem Relation Age of Onset  . Obesity Mother   . High Cholesterol Mother   . Arthritis Mother   . Hyperlipidemia Mother   . Hypertension Mother   . High Cholesterol Father   . Hypothyroidism Father   . Prostate cancer Father   . Hyperlipidemia Father   . Hypertension Father   . Cancer Father 51       Prostate ca  . Prostate cancer Maternal Uncle   . Colon cancer Maternal Uncle   . Breast cancer Paternal Aunt   . Arthritis Maternal Grandmother   . Stroke Maternal Grandmother   . Hypertension Maternal Grandmother   . Mental illness Maternal Grandmother   . Hypertension Maternal Grandfather   . Hyperlipidemia Paternal Grandmother   . Stroke Paternal Grandmother   . Prostate  cancer Paternal Grandfather   . Hyperlipidemia Paternal Grandfather   . Heart disease Paternal Grandfather   . Hypertension Paternal Grandfather   . Hypothyroidism Paternal Grandfather   . Prostate cancer Maternal Uncle   . Breast cancer Paternal Aunt   . Diabetes Paternal Uncle     Social History   Socioeconomic History  . Marital status: Married    Spouse name: Not on file  . Number of children: 0  . Years of education: MD  . Highest education level: Not on file  Occupational History  . Not on file  Social Needs  . Financial resource strain: Not on file  . Food insecurity:    Worry: Not on file    Inability: Not on file  . Transportation needs:    Medical: Not on file    Non-medical: Not on file  Tobacco Use  . Smoking status: Never Smoker  . Smokeless tobacco: Never Used  Substance and Sexual Activity  . Alcohol use: Never    Alcohol/week: 0.0 standard drinks    Frequency: Never    Comment: sober for 23 years  . Drug use: No  . Sexual activity: Yes    Birth control/protection: None  Lifestyle  . Physical activity:    Days per week: Not on file    Minutes per session: Not on file  . Stress: Not on file  Relationships  . Social connections:    Talks on phone: Not on file    Gets together: Not on file    Attends religious service: Not on file    Active member of club or organization: Not on file    Attends meetings of clubs or organizations: Not on file    Relationship status: Not on file  . Intimate partner violence:    Fear of current or ex partner: Not on file    Emotionally abused: Not on file    Physically abused: Not on file    Forced sexual activity: Not on file  Other Topics Concern  . Not on file  Social History Narrative   ** Merged History Encounter **       ** Data from: 05/12/15 Enc Dept: [facility]       ** Data from: 06/30/15 Enc Dept: [facility]   Married. 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   Live in [place].  Enjoys running, mountain bike, kayak.     Review of Systems  PHYSICAL EXAMINATION:    BP 120/68 (BP Location: Right Arm, Patient Position: Sitting, Cuff Size: Normal)   Pulse 72   Resp 14   Ht 5\' 5"  (1.651 m)   Wt 133 lb 9.6 oz (60.6 kg)   LMP 01/14/2019   BMI 22.23 kg/m     General appearance: alert, cooperative and appears stated age   Pelvic US Multifibroid uterus.  Largest 27 mm.  EMS 4.87 mm. Endometrial mass, 6 mm with vascular flow. Normal ovaries.  No free fluid.   Sonohysterogram/EMB Consent for procedures. Sterile prep with Hibiclens.  Tenaculum to anterior cervical lip. NS injected and filling defect noted __12__ mm. Reprep with Hibiclens.  Pipelle passed x 2 to 7 cm.  Tissue to pathology.  No complications.  Minimal EBL.   Chaperone was present for exam.  ASSESSMENT  Irregular bleeding.  Endometrial mass.  Fibroids.  Hx possible TIA.  Migraine with aura.  PLAN  Fu EMB.  Instructions/precautions given.  Discussed fibroids and endometrial mass.   Discussion of hysteroscopy with Myosure polypectomy, dilation and curettage.  Risks, benefits, and alternatives reviewed. Risks include but are not limited to bleeding, infection, damage to surrounding organs including uterine perforation requiring hospitalization and laparoscopy, pulmonary edema, reaction to anesthesia, DVT, PE, death, need for further treatment    Surgical expectations and recovery discussed.  Patient wishes to proceed. ACOG HO on hysteroscopy and dilation and curettage.  An After Visit Summary was printed and given to the patient.  _25_____ minutes face to face time of which over 50% was spent in counseling.

## 2019-01-19 DIAGNOSIS — R05 Cough: Secondary | ICD-10-CM | POA: Diagnosis not present

## 2019-01-19 DIAGNOSIS — B349 Viral infection, unspecified: Secondary | ICD-10-CM | POA: Diagnosis not present

## 2019-01-23 ENCOUNTER — Encounter: Payer: 59 | Admitting: Primary Care

## 2019-01-24 ENCOUNTER — Telehealth: Payer: Self-pay | Admitting: Primary Care

## 2019-01-24 NOTE — Telephone Encounter (Signed)
Left message asking pt to call office regarding fmla °

## 2019-01-24 NOTE — Telephone Encounter (Signed)
Spoke with pt she stated she would call me in a day or 2 to give me results of test and when she can go back to work

## 2019-01-25 ENCOUNTER — Telehealth: Payer: Self-pay | Admitting: Obstetrics and Gynecology

## 2019-01-25 ENCOUNTER — Encounter: Payer: Self-pay | Admitting: Obstetrics and Gynecology

## 2019-01-25 DIAGNOSIS — R05 Cough: Secondary | ICD-10-CM | POA: Diagnosis not present

## 2019-01-25 MED ORDER — DOXYCYCLINE MONOHYDRATE 100 MG PO CAPS: 100.0000 mg | ORAL_CAPSULE | Freq: Two times a day (BID) | ORAL | 0 refills | Status: AC

## 2019-01-25 NOTE — Telephone Encounter (Signed)
Patient sent the following correspondence through [facility]. Routing to triage to assist patient with request.  Message   Greetings. I've been running a low grade temp since Friday with respiratory symptoms so thought this was all URI related. However, I've had discomfort like very mild cramping in my pelvis. No discharge, no dysuria, no foul odor, no increased bleeding. Is this typical after EMB? I'm at urgent care now getting a chest X-ray. Please call or write back. Thank you   Last seen: 01/17/19

## 2019-01-25 NOTE — Telephone Encounter (Signed)
Spoke with patient.  01/18/2019 began with symptoms as below.  Rates pelvic pain 2/10. "feels tender"  Seen in urgent care at [facility], CBC WNL, Flu panel negative, negative chest xray per patient.  Being ruled out for Covid-19 but results are not available yet per patient. She is on quarantine.  Low grade temperature.   Reviewed with Dr. Quincy Simmonds and orders received. Doxycycline 100 mg po BID x 7 days.  Orders placed per patient request for pharmacy in [place].  Pt will update [place] on her status. ED if not improved or fever increases. Pt verbalizes understanding.

## 2019-01-28 ENCOUNTER — Encounter: Payer: Self-pay | Admitting: Obstetrics and Gynecology

## 2019-01-29 ENCOUNTER — Telehealth: Payer: Self-pay | Admitting: Obstetrics and Gynecology

## 2019-01-29 NOTE — Telephone Encounter (Signed)
Thanks for the update.  I do not have any additional recommendations.

## 2019-01-29 NOTE — Telephone Encounter (Signed)
Spoke to pt she stated she still has not received results   She will call me once she receives them

## 2019-01-29 NOTE — Telephone Encounter (Signed)
Greetings. Just a quick update. Most recent temp was 100.9 at 4:30 today. No test results from [facility] yet. On doxycycline now since Friday evening. Just very mild (2 out of 10) suprapubic/midline pelvic discomfort, no better but no worse. No vaginal odor or discharge or bleeding or dysuria. Perhaps I pulled a muscle during the procedure(?). I just wanted to keep you posted. Thank you so much for your care. Peace, [place]

## 2019-01-29 NOTE — Telephone Encounter (Signed)
Please let us know of her test results from her COVID 19 testing.   Cc- Dr. Sabra Heck

## 2019-01-29 NOTE — Telephone Encounter (Signed)
Spoke with patient. Temp today 100.3, takes 325 mg aspirin daily. Tested for Covid-19 on 3/20 through PCP, results not back yet. Reports SOB and cough improving, in quarantine. EMB on 3/12, benign endometrial polyp, planning to proceed with hysteroscopy and D&C. Reports scant amount of dark blood, "mild" pelvic cramping 2/10. Denies dysuria, vaginal odor, N/V, appetite is good. Started on doxycycline on 3/20. PCP is up to date on symptoms. Advised patient to f/u with PCP for respiratory symptoms, will update Dr. Quincy Simmonds and review with covering provider, will return call if any additional recommendations.  Advised to return call to office if new GYN symptoms develop such as heavy bleeding or severe pain. Patient verbalizes understanding and is agreeable.   Routing to Dr. Sabra Heck -any additional recommendations?    Cc: Dr. Quincy Simmonds

## 2019-01-30 DIAGNOSIS — R509 Fever, unspecified: Secondary | ICD-10-CM

## 2019-01-30 NOTE — Telephone Encounter (Signed)
Spoke with patient. Patient states that she got her first Covid 19 testing back from 3/14 and it was negative. Was tested again on 3/20 due to increasing symptoms and that is still pending. She will call us with results. States that she is still having mild pelvic cramping. 2/10 pain. Denies dysuria, vaginal odor, nausea, and vomiting. Was started on Doxycycline on 3/20. Patient's temperature is 100.3. Is taking 325 mg of aspiring daily while she awaits Covid 19 testing from 3/20. Will call if she develops heavy bleeding or severe pain.

## 2019-01-30 NOTE — Telephone Encounter (Signed)
Thank you for the update!

## 2019-01-30 NOTE — Telephone Encounter (Signed)
Left message to call Kaitlyn at [phone]. 

## 2019-01-30 NOTE — Telephone Encounter (Signed)
Patient returning call to Kaitlyn. °

## 2019-01-31 ENCOUNTER — Other Ambulatory Visit: Payer: Self-pay

## 2019-01-31 ENCOUNTER — Encounter: Payer: Self-pay | Admitting: Obstetrics and Gynecology

## 2019-01-31 ENCOUNTER — Ambulatory Visit (INDEPENDENT_AMBULATORY_CARE_PROVIDER_SITE_OTHER): Payer: Self-pay | Admitting: Internal Medicine

## 2019-01-31 ENCOUNTER — Encounter: Payer: Self-pay | Admitting: Internal Medicine

## 2019-01-31 DIAGNOSIS — J988 Other specified respiratory disorders: Secondary | ICD-10-CM

## 2019-01-31 DIAGNOSIS — B9789 Other viral agents as the cause of diseases classified elsewhere: Secondary | ICD-10-CM

## 2019-01-31 NOTE — Telephone Encounter (Signed)
If patient's second Coronavirus testing is negative and if she is still having any significant pelvic pain, she needs an office visit with me.

## 2019-01-31 NOTE — Telephone Encounter (Signed)
Robin, where did we land with her FMLA paperwork?

## 2019-01-31 NOTE — Telephone Encounter (Signed)
fmla paperwork in Jessica Dodson's in box  °For review and signature °

## 2019-01-31 NOTE — Progress Notes (Signed)
Patient ID: Jessica Courser, MD, female   DOB: 06/10/1966, 53 y.o.   MRN: [id number]   EVISIT (phone):      [facility]      Reason for Consult:fever    Referring Physician: NP Gentry Fitz    Patient ID: Jessica Courser, MD, female    DOB: 08-20-1966, 53 y.o.   MRN: [id number]  HPI:   Jessica Dodson has been referred for evaluation of a prolonged fever.  She had an endometrial biopsy done on 01/17/19 by Dr. Quincy Simmonds for irregular bleeding and since that time she developed a cough and sob the next day and then fever.  She also had a sick contact with her husband who had similar symptoms of fever and cough though resolved rather quickly.  Her fever and cough, SOB particularly with activity have persisted since that time, though she feels she has been improving this week.  She did have a recent temperature of 100.4 but mainly lower now.  Her current self-reported vitals are T 99.8, pulse 58, though increased to 88 with activity, RR 14.  Her cough  Now is about resolved as well.  She was advised by [facility] to have and ID consult.   She did go to urgent care 3/20 and work up unrevealing including a negative RVP, normal WBC.  She has also had COVID-19 testing x 2 (initial sample thought to be lost).    Past Medical History:  Diagnosis Date  . Abnormal uterine bleeding   . ADHD (attention deficit hyperactivity disorder)   . Allergy   . Anxiety   . Cold sore   . Complication of anesthesia    hard to wake up after tonsillectomy and nasoseptoplasty  . Depression    recurrent, not on meds at this time  . Depression   . Fibroid   . H/O alcohol abuse    19 years sober  . Headache    migraine (once a year) usually has an aura  . Migraine with aura   . Pneumonia 2016  . Urinary incontinence     Prior to Admission medications   Medication Sig Start Date End Date Taking? Authorizing Provider  aspirin 325 MG tablet Take 325 mg by mouth daily.    [provider]   atorvastatin (LIPITOR) 10 MG tablet Take 10 mg by mouth daily.    [provider]  Cholecalciferol (VITAMIN D3) 5000 UNITS TABS Take 5,000 Units by mouth once a week.    [provider]  doxycycline (MONODOX) 100 MG capsule Take 1 capsule (100 mg total) by mouth 2 (two) times daily for 7 days. Take with food. Avoid sun exposure. 01/25/19 02/01/19  Nunzio Cobbs, MD  escitalopram (LEXAPRO) 10 MG tablet TAKE 1 TABLET BY MOUTH DAILY. Patient taking differently: Take 10 mg by mouth daily.  07/31/18   Pleas Koch, NP  valACYclovir (VALTREX) 1000 MG tablet TAKE 2 TABLETS BY MOUTH TWICE DAILY FOR 1 DAY AS NEEDED FOR BREAKOUTS. 10/03/17   Pleas Koch, NP  vitamin B-12 (CYANOCOBALAMIN) 500 MCG tablet Take 500 mcg by mouth once a week.     [provider]    No Known Allergies  Social History   Tobacco Use  . Smoking status: Never Smoker  . Smokeless tobacco: Never Used  Substance Use Topics  . Alcohol use: Never    Alcohol/week: 0.0 standard drinks    Frequency: Never    Comment: sober  for 23 years  . Drug use: No    Family History  Problem Relation Age of Onset  . Obesity Mother   . High Cholesterol Mother   . Arthritis Mother   . Hyperlipidemia Mother   . Hypertension Mother   . High Cholesterol Father   . Hypothyroidism Father   . Prostate cancer Father   . Hyperlipidemia Father   . Hypertension Father   . Cancer Father 53       Prostate ca  . Prostate cancer Maternal Uncle   . Colon cancer Maternal Uncle   . Breast cancer Paternal Aunt   . Arthritis Maternal Grandmother   . Stroke Maternal Grandmother   . Hypertension Maternal Grandmother   . Mental illness Maternal Grandmother   . Hypertension Maternal Grandfather   . Hyperlipidemia Paternal Grandmother   . Stroke Paternal Grandmother   . Prostate cancer Paternal Grandfather   . Hyperlipidemia Paternal Grandfather   . Heart disease Paternal Grandfather   . Hypertension  Paternal Grandfather   . Hypothyroidism Paternal Grandfather   . Prostate cancer Maternal Uncle   . Breast cancer Paternal Aunt   . Diabetes Paternal Uncle     Review of Systems  Constitutional: positive for sweats, fatigue, malaise, anorexia and some recent weight loss before this but related to marathon training  Gastrointestinal: negative for nausea, vomiting and diarrhea Genitourinary: negative for frequency and dysuria All other systems reviewed and are negative      Labs: Lab Results  Component Value Date   WBC 5.2 09/10/2018   HGB 13.0 09/10/2018   HCT 39.9 09/10/2018   MCV 96.1 09/10/2018   PLT 256 09/10/2018    Lab Results  Component Value Date   CREATININE 0.68 09/10/2018   BUN 9 09/10/2018   NA 137 09/10/2018   K 3.5 09/10/2018   CL 100 09/10/2018   CO2 27 09/10/2018    Lab Results  Component Value Date   ALT 16 10/24/2018   AST 21 10/24/2018   ALKPHOS 50 10/24/2018   BILITOT 0.9 10/24/2018   INR 0.91 09/03/2018     Assessment: viral illness, NOS.  She seems to be recovering.  Viral illnesses can last up to 2-3 weeks.  No other work up indicated without new or localizing symptoms.  She has had a negative CXR, negative viral work up.    Plan: 1) continue supportive care.  Thanks for consultation.    Time spent: 20 minutes

## 2019-01-31 NOTE — Telephone Encounter (Signed)
Routing update to Dr. Quincy Simmonds.

## 2019-01-31 NOTE — Telephone Encounter (Signed)
Left message to call Kaitlyn at [phone]. 

## 2019-01-31 NOTE — Telephone Encounter (Signed)
Patient sent the following correspondence through [facility]. Routing to triage to assist patient with request.  Greetings! My temp this morning is 99.3 degrees. My Coronavirus test was negative. Just a quick update. [place], [place]

## 2019-02-01 ENCOUNTER — Ambulatory Visit: Payer: Commercial Managed Care - PPO | Admitting: Infectious Disease

## 2019-02-01 ENCOUNTER — Other Ambulatory Visit: Payer: Self-pay | Admitting: Primary Care

## 2019-02-01 ENCOUNTER — Telehealth: Payer: Self-pay

## 2019-02-01 DIAGNOSIS — F3342 Major depressive disorder, recurrent, in full remission: Secondary | ICD-10-CM

## 2019-02-01 NOTE — Telephone Encounter (Signed)
Spoke with patient. Patient reports Covid-19 test on 3/14 and 3/20 were negative. Pelvic pain has resolved, no vaginal bleeding or odor. Completed doxycycline 3/27. Patient states she had televisit on 3/26 with infectious disease, was advised symptoms likely related to lower respiratory infection. Temp today 99.8, takes ASA daily. Advised as seen below per Dr. Quincy Simmonds. No OV scheduled at this time, patient aware to return call to office with any additional questions or concerns.   Routing to provider for final review. Patient is agreeable to disposition. Will close encounter.

## 2019-02-01 NOTE — Telephone Encounter (Signed)
Paperwork faxed °

## 2019-02-01 NOTE — Telephone Encounter (Signed)
Pt left voicemail on triage asking to ammend FMLA paperwork to update first day out 01-18-19  and first day back as 02-04-19.

## 2019-02-01 NOTE — Telephone Encounter (Signed)
Patient is returning call to triage nurse. °

## 2019-02-01 NOTE — Telephone Encounter (Signed)
Completed and handed to Robin. °

## 2019-02-04 MED FILL — ESCITALOPRAM 10 MG TABLET: 10 | 90 days supply | Qty: 90 | Fill #0

## 2019-02-04 NOTE — Telephone Encounter (Signed)
Pt aware.

## 2019-02-04 NOTE — Telephone Encounter (Signed)
Paperwork updated and faxed pt aware

## 2019-02-04 NOTE — Telephone Encounter (Signed)
Copy for pt °Copy for scan °

## 2019-02-27 ENCOUNTER — Ambulatory Visit (INDEPENDENT_AMBULATORY_CARE_PROVIDER_SITE_OTHER): Payer: 59 | Admitting: Primary Care

## 2019-02-27 ENCOUNTER — Other Ambulatory Visit: Payer: Self-pay | Admitting: *Deleted

## 2019-02-27 ENCOUNTER — Encounter: Payer: Self-pay | Admitting: Primary Care

## 2019-02-27 DIAGNOSIS — F32A Depression, unspecified: Secondary | ICD-10-CM

## 2019-02-27 DIAGNOSIS — F329 Major depressive disorder, single episode, unspecified: Secondary | ICD-10-CM

## 2019-02-27 DIAGNOSIS — F419 Anxiety disorder, unspecified: Secondary | ICD-10-CM

## 2019-02-27 MED ORDER — SERTRALINE HCL 50 MG PO TABS: 50.0000 mg | ORAL_TABLET | Freq: Every day | ORAL | 0 refills | Status: AC

## 2019-02-27 MED ORDER — HYDROXYZINE HCL 10 MG PO TABS: 10.0000 mg | ORAL_TABLET | Freq: Two times a day (BID) | ORAL | 0 refills | Status: DC | PRN

## 2019-02-27 NOTE — Patient Instructions (Signed)
Stop escitalopram (Lexapro) 10 mg for anxiety and depression. Start sertraline (Zoloft) 50 mg for anxiety and depression.   You may take 1-2 tablets of the hydroxyzine twice daily as needed for breakthrough anxiety. This may cause drowsiness.   You will be contacted regarding your referral to psychiatry.  Please let us know if you have not been contacted within 3-4 days.  Please don't hesitate to message/call me if you need anything! It was a pleasure to see you today!

## 2019-02-27 NOTE — Assessment & Plan Note (Signed)
Increase in anxiety symptoms over the last 6 months, worse over the last 2 months. Suspect that her 10 days off of Lexapro recently has contributed but her symptoms have been going on for a lot longer. Stop Lexapro, switch to Zoloft 50 mg. Rx for hydroxyzine 10 mg sent to pharmacy to use PRN for acute anxiety.  Referral placed to psychiatry for further evaluation. She will update if symptoms progress.

## 2019-02-27 NOTE — Progress Notes (Signed)
Subjective:    Patient ID: Jessica Courser, MD, female    DOB: 1966/05/02, 53 y.o.   MRN: [id number]  HPI  Virtual Visit via Video Note  I connected with Jessica Courser, MD on 02/27/19 at  3:00 PM EDT by a video enabled telemedicine application and verified that I am speaking with the correct person using two identifiers.   I discussed the limitations of evaluation and management by telemedicine and the availability of in person appointments. The patient expressed understanding and agreed to proceed. She is at work, I am in the office.  History of Present Illness:  Jessica Dodson is a 53 year old female with a history of generalized anxiety disorder, major depressive disorder, and ADHD who presents today via video with a chief complaint of anxiety.  Symptoms have been present for the last 6 months but have signigicantly progressed over the last two months since the Covid-19 infection. Symptoms include thinking worst case scenario, second guessing herself, constant worry, feeling paranoid, difficulty sleeping. 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 She's been doing what she can to handle her symptoms by reading her bible, doing yoga, and meditating without much improvement.   She ran out of her Lexapro recently due to a mix up in the pharmacy, she was out for 10 days total before she resumed her Lexapro five days ago. She really doesn't feel as though Lexapro was helping any longer anyway. She's been on Paxil in the past which caused a lot of weight gain. She's also been on Seroquel in years past.  She denies SI/HI, depression.     Observations/Objective:  Mood appropriate, tearful at times during visit. Alert and oriented. Speaking in complete sentences.   Assessment and Plan:  Increase in anxiety symptoms over the last 6 months, worse over the last 2 months. Suspect that her 10 days off of Lexapro recently has contributed but her symptoms have been  going on for a lot longer. Stop Lexapro, switch to Zoloft 50 mg. Rx for hydroxyzine 10 mg sent to pharmacy to use PRN for acute anxiety.  Referral placed to psychiatry for further evaluation. She will update if symptoms progress.  Follow Up Instructions:  Stop escitalopram (Lexapro) 10 mg for anxiety and depression. Start sertraline (Zoloft) 50 mg for anxiety and depression.   You may take 1-2 tablets of the hydroxyzine twice daily as needed for breakthrough anxiety. This may cause drowsiness.   You will be contacted regarding your referral to psychiatry.  Please let us know if you have not been contacted within 3-4 days.  Please don't hesitate to message/call me if you need anything! It was a pleasure to see you today!   I discussed the assessment and treatment plan with the patient. The patient was provided an opportunity to ask questions and all were answered. The patient agreed with the plan and demonstrated an understanding of the instructions.   The patient was advised to call back or seek an in-person evaluation if the symptoms worsen or if the condition fails to improve as anticipated.     Jessica Koch, NP    Review of Systems  Constitutional: Negative for fever.  Respiratory: Negative for shortness of breath.   Cardiovascular: Negative for chest pain.  Psychiatric/Behavioral: Positive for sleep disturbance. The patient is nervous/anxious.        See HPI       Past Medical History:  Diagnosis Date  . Abnormal  uterine bleeding   . ADHD (attention deficit hyperactivity disorder)   . Allergy   . Anxiety   . Cold sore   . Complication of anesthesia    hard to wake up after tonsillectomy and nasoseptoplasty  . Depression    recurrent, not on meds at this time  . Depression   . Fibroid   . H/O alcohol abuse    19 years sober  . Headache    migraine (once a year) usually has an aura  . Migraine with aura   . Pneumonia 2016  . Urinary incontinence       Social History   Socioeconomic History  . Marital status: Married    Spouse name: Not on file  . Number of children: 0  . Years of education: MD  . Highest education level: Not on file  Occupational History  . Not on file  Social Needs  . Financial resource strain: Not on file  . Food insecurity:    Worry: Not on file    Inability: Not on file  . Transportation needs:    Medical: Not on file    Non-medical: Not on file  Tobacco Use  . Smoking status: Never Smoker  . Smokeless tobacco: Never Used  Substance and Sexual Activity  . Alcohol use: Never    Alcohol/week: 0.0 standard drinks    Frequency: Never    Comment: sober for 23 years  . Drug use: No  . Sexual activity: Yes    Birth control/protection: None  Lifestyle  . Physical activity:    Days per week: Not on file    Minutes per session: Not on file  . Stress: Not on file  Relationships  . Social connections:    Talks on phone: Not on file    Gets together: Not on file    Attends religious service: Not on file    Active member of club or organization: Not on file    Attends meetings of clubs or organizations: Not on file    Relationship status: Not on file  . Intimate partner violence:    Fear of current or ex partner: Not on file    Emotionally abused: Not on file    Physically abused: Not on file    Forced sexual activity: Not on file  Other Topics Concern  . Not on file  Social History Narrative   ** Merged History Encounter **       ** Data from: 05/12/15 Enc Dept: [facility]       ** Data from: 06/30/15 Enc Dept: [facility]   Married. 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  Live in [place].  Enjoys running, mountain bike, kayak.     Past Surgical History:  Procedure Laterality Date  . BREAST BIOPSY Right   . BREAST CYST ASPIRATION Left   . BREAST SURGERY Right    biopsy - benign  . COLONOSCOPY    . LIPOMA EXCISION Left    6 cm tumor  . MASS EXCISION  Left 05/08/2015   Procedure: EXCISION OF LEFT DELTOID MASS;  Surgeon: [name], MD;  Location: [facility];  Service: General;  Laterality: Left;  . NASAL SEPTUM SURGERY    . nevus removed     4 dysplastic nevi removed  . TONSILLECTOMY    . urethral stricture surgery     at age 22    Family History  Problem Relation Age of Onset  . Obesity  Mother   . High Cholesterol Mother   . Arthritis Mother   . Hyperlipidemia Mother   . Hypertension Mother   . High Cholesterol Father   . Hypothyroidism Father   . Prostate cancer Father   . Hyperlipidemia Father   . Hypertension Father   . Cancer Father 16       Prostate ca  . Prostate cancer Maternal Uncle   . Colon cancer Maternal Uncle   . Breast cancer Paternal Aunt   . Arthritis Maternal Grandmother   . Stroke Maternal Grandmother   . Hypertension Maternal Grandmother   . Mental illness Maternal Grandmother   . Hypertension Maternal Grandfather   . Hyperlipidemia Paternal Grandmother   . Stroke Paternal Grandmother   . Prostate cancer Paternal Grandfather   . Hyperlipidemia Paternal Grandfather   . Heart disease Paternal Grandfather   . Hypertension Paternal Grandfather   . Hypothyroidism Paternal Grandfather   . Prostate cancer Maternal Uncle   . Breast cancer Paternal Aunt   . Diabetes Paternal Uncle     No Known Allergies  Current Outpatient Medications on File Prior to Visit  Medication Sig Dispense Refill  . aspirin 325 MG tablet Take 325 mg by mouth daily.    . Cholecalciferol (VITAMIN D3) 5000 UNITS TABS Take 5,000 Units by mouth once a week.    . valACYclovir (VALTREX) 1000 MG tablet TAKE 2 TABLETS BY MOUTH TWICE DAILY FOR 1 DAY AS NEEDED FOR BREAKOUTS. 30 tablet 3  . vitamin B-12 (CYANOCOBALAMIN) 500 MCG tablet Take 500 mcg by mouth once a week.      No current facility-administered medications on file prior to visit.     There were no vitals taken for this visit.   Objective:   Physical Exam   Constitutional: She is oriented to person, place, and time. She appears well-nourished.  Respiratory: Effort normal.  Neurological: She is alert and oriented to person, place, and time.  Skin: Skin is dry.  Psychiatric: She has a normal mood and affect.  Tearful at times, appears anxious.           Assessment & Plan:

## 2019-02-27 NOTE — Telephone Encounter (Signed)
Jessica Dodson, can you schedule her for virtual visit?

## 2019-03-02 DIAGNOSIS — F411 Generalized anxiety disorder: Secondary | ICD-10-CM | POA: Diagnosis not present

## 2019-03-02 DIAGNOSIS — F5105 Insomnia due to other mental disorder: Secondary | ICD-10-CM | POA: Diagnosis not present

## 2019-03-02 DIAGNOSIS — F322 Major depressive disorder, single episode, severe without psychotic features: Secondary | ICD-10-CM | POA: Diagnosis not present

## 2019-03-05 DIAGNOSIS — F4311 Post-traumatic stress disorder, acute: Secondary | ICD-10-CM | POA: Diagnosis not present

## 2019-03-08 DIAGNOSIS — F322 Major depressive disorder, single episode, severe without psychotic features: Secondary | ICD-10-CM | POA: Diagnosis not present

## 2019-03-08 DIAGNOSIS — F411 Generalized anxiety disorder: Secondary | ICD-10-CM | POA: Diagnosis not present

## 2019-03-08 DIAGNOSIS — F5105 Insomnia due to other mental disorder: Secondary | ICD-10-CM | POA: Diagnosis not present

## 2019-03-12 DIAGNOSIS — F4311 Post-traumatic stress disorder, acute: Secondary | ICD-10-CM | POA: Diagnosis not present

## 2019-03-15 DIAGNOSIS — F322 Major depressive disorder, single episode, severe without psychotic features: Secondary | ICD-10-CM | POA: Diagnosis not present

## 2019-03-15 DIAGNOSIS — F411 Generalized anxiety disorder: Secondary | ICD-10-CM | POA: Diagnosis not present

## 2019-03-15 DIAGNOSIS — F5105 Insomnia due to other mental disorder: Secondary | ICD-10-CM | POA: Diagnosis not present

## 2019-03-19 DIAGNOSIS — F4311 Post-traumatic stress disorder, acute: Secondary | ICD-10-CM | POA: Diagnosis not present

## 2019-03-20 MED FILL — SERTRALINE HCL 100 MG TAB: 100 | 90 days supply | Qty: 90 | Fill #0

## 2019-03-20 MED FILL — HYDROXYZINE PAMOATE 25 MG C: 25 | 90 days supply | Qty: 270 | Fill #0

## 2019-03-26 DIAGNOSIS — F4311 Post-traumatic stress disorder, acute: Secondary | ICD-10-CM | POA: Diagnosis not present

## 2019-03-28 DIAGNOSIS — F322 Major depressive disorder, single episode, severe without psychotic features: Secondary | ICD-10-CM | POA: Diagnosis not present

## 2019-03-28 DIAGNOSIS — F5105 Insomnia due to other mental disorder: Secondary | ICD-10-CM | POA: Diagnosis not present

## 2019-03-28 DIAGNOSIS — F411 Generalized anxiety disorder: Secondary | ICD-10-CM | POA: Diagnosis not present

## 2019-04-02 ENCOUNTER — Other Ambulatory Visit: Payer: Self-pay | Admitting: Primary Care

## 2019-04-02 DIAGNOSIS — E785 Hyperlipidemia, unspecified: Secondary | ICD-10-CM

## 2019-04-02 DIAGNOSIS — F4311 Post-traumatic stress disorder, acute: Secondary | ICD-10-CM | POA: Diagnosis not present

## 2019-04-02 MED ORDER — ATORVASTATIN CALCIUM 20 MG PO TABS: 20.0000 mg | ORAL_TABLET | Freq: Every evening | ORAL | 2 refills | Status: AC

## 2019-04-02 MED FILL — ATORVASTATIN 20 MG TABLET: 20 | 90 days supply | Qty: 90 | Fill #0

## 2019-04-02 NOTE — Telephone Encounter (Signed)
Received faxed refill request for atorvastatin 20 mg  Per pharmacy, patient could not tolerate the 40 mg  Last seen on 02/27/2019.

## 2019-04-02 NOTE — Telephone Encounter (Signed)
Noted, refill sent to pharmacy. 

## 2019-04-03 ENCOUNTER — Telehealth: Payer: Self-pay | Admitting: Obstetrics and Gynecology

## 2019-04-03 NOTE — Telephone Encounter (Signed)
Call placed to patient to review benefits for surgery. Left voicemail message requesting a return call

## 2019-04-08 NOTE — Telephone Encounter (Signed)
Initial ultrasound was done March 12.  Call to patient. States spotting, discolored discharge and cervical mucous has resolved since endometrial biopsy. Reluctant to proceed without rechecking to see if still needed. Thinks maybe polyp was dislodged with biopsy.  Also has concerns about Covid 19. Discussed [facility] to keep patients safe.  Patient would like to wait 3 more months to monitor bleeding and re-evaluate.   Advised will send to Dr Quincy Simmonds for review. Will need to call for any onset of bleeding to be seen.

## 2019-04-08 NOTE — Telephone Encounter (Signed)
Ok to observe bleeding pattern.  Please have her schedule an office with with me for beginning of September, 2020.  I would like her to return sooner if she has recurrent abnormal uterine bleeding.

## 2019-04-08 NOTE — Telephone Encounter (Signed)
Spoke with patient in regards to benefits for recommend surgery. Patient advises the bleeding has completely stopped since having the endometrial biopsy, and is wanting to know if it would be appropriate to do a follow up ultrasound in three months, before proceeding with surgery.  Routing to Dr Quincy Simmonds  cc: Lamont Snowball, RN

## 2019-04-09 DIAGNOSIS — F4311 Post-traumatic stress disorder, acute: Secondary | ICD-10-CM | POA: Diagnosis not present

## 2019-04-09 NOTE — Telephone Encounter (Signed)
Left message to call Arantxa Piercey, RN at [facility] [phone].   

## 2019-04-11 NOTE — Telephone Encounter (Signed)
Spoke with patient, advised as seen below per Dr. Quincy Simmonds. Patient agreeable, OV scheduled for 9/2 at 4pm.   Patient request to let Dr. Quincy Simmonds know FYI, her younger sister was just Dx with a fibroid and polyp.   Routing to provider for final review. Patient is agreeable to disposition. Will close encounter.

## 2019-04-16 DIAGNOSIS — F4311 Post-traumatic stress disorder, acute: Secondary | ICD-10-CM | POA: Diagnosis not present

## 2019-04-22 DIAGNOSIS — F411 Generalized anxiety disorder: Secondary | ICD-10-CM | POA: Diagnosis not present

## 2019-04-22 DIAGNOSIS — F322 Major depressive disorder, single episode, severe without psychotic features: Secondary | ICD-10-CM | POA: Diagnosis not present

## 2019-04-22 DIAGNOSIS — F5105 Insomnia due to other mental disorder: Secondary | ICD-10-CM | POA: Diagnosis not present

## 2019-04-30 DIAGNOSIS — F4311 Post-traumatic stress disorder, acute: Secondary | ICD-10-CM | POA: Diagnosis not present

## 2019-05-15 DIAGNOSIS — F4311 Post-traumatic stress disorder, acute: Secondary | ICD-10-CM | POA: Diagnosis not present

## 2019-05-28 DIAGNOSIS — F4311 Post-traumatic stress disorder, acute: Secondary | ICD-10-CM | POA: Diagnosis not present

## 2019-06-05 DIAGNOSIS — F5105 Insomnia due to other mental disorder: Secondary | ICD-10-CM | POA: Diagnosis not present

## 2019-06-05 DIAGNOSIS — F411 Generalized anxiety disorder: Secondary | ICD-10-CM | POA: Diagnosis not present

## 2019-06-05 DIAGNOSIS — F322 Major depressive disorder, single episode, severe without psychotic features: Secondary | ICD-10-CM | POA: Diagnosis not present

## 2019-06-05 MED FILL — SERTRALINE HCL 100 MG TAB: 100 | 90 days supply | Qty: 135 | Fill #0

## 2019-06-11 DIAGNOSIS — F4311 Post-traumatic stress disorder, acute: Secondary | ICD-10-CM | POA: Diagnosis not present

## 2019-06-19 MED FILL — HYDROXYZINE PAMOATE 25 MG C: 25 | 90 days supply | Qty: 270 | Fill #0

## 2019-06-25 DIAGNOSIS — F4311 Post-traumatic stress disorder, acute: Secondary | ICD-10-CM | POA: Diagnosis not present

## 2019-07-08 ENCOUNTER — Telehealth: Payer: Self-pay | Admitting: Obstetrics and Gynecology

## 2019-07-08 DIAGNOSIS — N939 Abnormal uterine and vaginal bleeding, unspecified: Secondary | ICD-10-CM

## 2019-07-08 NOTE — Telephone Encounter (Signed)
Ok to reschedule the patient and plan for pelvic ultrasound on the day of her visit.

## 2019-07-08 NOTE — Telephone Encounter (Signed)
Call to patient. Advised patient of message as seen below from Dr. Quincy Simmonds. Patient scheduled for PUS on 07-18-2019 at 1030, with 1100 consult with Dr. Quincy Simmonds. Patient agreeable to date and time of appointment. Future order placed for PUS.  Routing to provider and will close encounter.

## 2019-07-08 NOTE — Telephone Encounter (Signed)
Call to patient. Patient states LMP started 07-07-2019. Patient asking to reschedule appointment due to training conflict. Patient asking if she will have a PUS the day of her appointment. States, "I hope there will be." RN advised would review with Dr. Quincy Simmonds and return call to schedule. Patient agreeable.   Routing to provider for review.

## 2019-07-08 NOTE — Telephone Encounter (Signed)
Patient is scheduled for follow up on 07/24/19 and didn't know if this appointment should be scheduled around her period. She was originally scheduled 07/12/19 with last menstrual starting on 07/07/19.

## 2019-07-09 DIAGNOSIS — F4311 Post-traumatic stress disorder, acute: Secondary | ICD-10-CM | POA: Diagnosis not present

## 2019-07-10 ENCOUNTER — Ambulatory Visit: Payer: Self-pay | Admitting: Obstetrics and Gynecology

## 2019-07-10 ENCOUNTER — Telehealth: Payer: Self-pay | Admitting: Obstetrics and Gynecology

## 2019-07-10 NOTE — Telephone Encounter (Signed)
Call placed to convey benefits for ultrasound. °

## 2019-07-18 ENCOUNTER — Ambulatory Visit (INDEPENDENT_AMBULATORY_CARE_PROVIDER_SITE_OTHER): Payer: 59 | Admitting: Obstetrics and Gynecology

## 2019-07-18 ENCOUNTER — Ambulatory Visit (INDEPENDENT_AMBULATORY_CARE_PROVIDER_SITE_OTHER): Payer: 59

## 2019-07-18 ENCOUNTER — Encounter: Payer: Self-pay | Admitting: Obstetrics and Gynecology

## 2019-07-18 ENCOUNTER — Other Ambulatory Visit: Payer: Self-pay

## 2019-07-18 VITALS — BP 140/82 | HR 50 | Temp 97.2°F | Ht 65.0 in | Wt 135.2 lb

## 2019-07-18 DIAGNOSIS — N949 Unspecified condition associated with female genital organs and menstrual cycle: Secondary | ICD-10-CM | POA: Diagnosis not present

## 2019-07-18 DIAGNOSIS — N939 Abnormal uterine and vaginal bleeding, unspecified: Secondary | ICD-10-CM

## 2019-07-18 DIAGNOSIS — D219 Benign neoplasm of connective and other soft tissue, unspecified: Secondary | ICD-10-CM | POA: Diagnosis not present

## 2019-07-18 NOTE — Progress Notes (Signed)
GYNECOLOGY  VISIT   HPI: 53 y.o.   Married  Caucasian  female   [id number] with Patient's last menstrual period was 07/07/2019 (exact date).   here for pelvic ultrasound.   Followed for fibroids (largest 27 mm) and a 6 mm endometrial mass with vascular flow noted on ultrasound.   Her evaluation was performed due to irregular bleeding.  Sonohysterogram showed a 12 mm filling defect of the endometrium.  EMB showed secretory endometrium and benign polyp.  She was scheduled for hysteroscopic surgery, but then delayed due to the pandemic.   No more unusual bleeding.  She is having regular menses since the endometrial biopsy.  No pain, discomfort.  Menses can range up to 44 days apart.  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 Being treated for PTSD after a patient committed suicide.   GYNECOLOGIC HISTORY: Patient's last menstrual period was 07/07/2019 (exact date). Contraception: none Menopausal hormone therapy:  n/a Last mammogram:   02-28-18 3D Neg/density D/BiRads1 Last pap smear: 01-17-18 Neg.  No hx of abnormal pap.        OB History    Gravida  0   Para  0   Term  0   Preterm  0   AB  0   Living        SAB  0   TAB  0   Ectopic  0   Multiple      Live Births                 Patient Active Problem List   Diagnosis Date Noted  . Abnormal uterine bleeding 12/10/2018  . Rash and nonspecific skin eruption 11/12/2018  . Left upper extremity numbness 09/12/2018  . Hyperlipidemia LDL goal <70 09/12/2018  . Superficial vein thrombosis 09/12/2018  . TIA (transient ischemic attack) 09/03/2018  . ADHD 12/07/2016  . Essential tremor 07/21/2016  . Herpes labialis 02/24/2016  . Preventative health care 10/20/2015  . Breast mass in female 10/12/2015  . Anxiety and depression 06/30/2015    Past Medical History:  Diagnosis Date  . Abnormal uterine bleeding   . ADHD (attention deficit hyperactivity disorder)   . Allergy    . Anxiety   . Cold sore   . Complication of anesthesia    hard to wake up after tonsillectomy and nasoseptoplasty  . Depression    recurrent, not on meds at this time  . Depression   . Fibroid   . H/O alcohol abuse    19 years sober  . Headache    migraine (once a year) usually has an aura  . Migraine with aura   . Pneumonia 2016  . Urinary incontinence     Past Surgical History:  Procedure Laterality Date  . BREAST BIOPSY Right   . BREAST CYST ASPIRATION Left   . BREAST SURGERY Right    biopsy - benign  . COLONOSCOPY    . LIPOMA EXCISION Left    6 cm tumor  . MASS EXCISION Left 05/08/2015   Procedure: EXCISION OF LEFT DELTOID MASS;  Surgeon: [name], MD;  Location: [facility];  Service: General;  Laterality: Left;  . NASAL SEPTUM SURGERY    . nevus removed     4 dysplastic nevi removed  . TONSILLECTOMY    . urethral stricture surgery     at age 21    Current Outpatient Medications  Medication Sig Dispense Refill  . aspirin 325 MG tablet Take  325 mg by mouth daily.    Marland Kitchen atorvastatin (LIPITOR) 20 MG tablet Take 1 tablet (20 mg total) by mouth every evening. For cholesterol. 90 tablet 2  . Cholecalciferol (VITAMIN D3) 5000 UNITS TABS Take 5,000 Units by mouth once a week.    . hydrOXYzine (VISTARIL) 25 MG capsule Take 1 capsule by mouth 3 (three) times daily as needed.    . sertraline (ZOLOFT) 100 MG tablet Take 1 tablet by mouth daily.    . sertraline (ZOLOFT) 50 MG tablet Take 1 tablet (50 mg total) by mouth daily. For anxiety. 90 tablet 0  . valACYclovir (VALTREX) 1000 MG tablet TAKE 2 TABLETS BY MOUTH TWICE DAILY FOR 1 DAY AS NEEDED FOR BREAKOUTS. 30 tablet 3  . vitamin B-12 (CYANOCOBALAMIN) 500 MCG tablet Take 500 mcg by mouth once a week.      No current facility-administered medications for this visit.      ALLERGIES: Patient has no known allergies.  Family History  Problem Relation Age of Onset  . Obesity Mother   . High Cholesterol Mother   . Arthritis  Mother   . Hyperlipidemia Mother   . Hypertension Mother   . High Cholesterol Father   . Hypothyroidism Father   . Prostate cancer Father   . Hyperlipidemia Father   . Hypertension Father   . Cancer Father 30       Prostate ca  . Prostate cancer Maternal Uncle   . Colon cancer Maternal Uncle   . Breast cancer Paternal Aunt   . Arthritis Maternal Grandmother   . Stroke Maternal Grandmother   . Hypertension Maternal Grandmother   . Mental illness Maternal Grandmother   . Hypertension Maternal Grandfather   . Hyperlipidemia Paternal Grandmother   . Stroke Paternal Grandmother   . Prostate cancer Paternal Grandfather   . Hyperlipidemia Paternal Grandfather   . Heart disease Paternal Grandfather   . Hypertension Paternal Grandfather   . Hypothyroidism Paternal Grandfather   . Prostate cancer Maternal Uncle   . Breast cancer Paternal Aunt   . Diabetes Paternal Uncle     Social History   Socioeconomic History  . Marital status: Married    Spouse name: Not on file  . Number of children: 0  . Years of education: MD  . Highest education level: Not on file  Occupational History  . Not on file  Social Needs  . Financial resource strain: Not on file  . Food insecurity    Worry: Not on file    Inability: Not on file  . Transportation needs    Medical: Not on file    Non-medical: Not on file  Tobacco Use  . Smoking status: Never Smoker  . Smokeless tobacco: Never Used  Substance and Sexual Activity  . Alcohol use: Never    Alcohol/week: 0.0 standard drinks    Frequency: Never    Comment: sober for 23 years  . Drug use: No  . Sexual activity: Yes    Birth control/protection: None  Lifestyle  . Physical activity    Days per week: Not on file    Minutes per session: Not on file  . Stress: Not on file  Relationships  . Social Herbalist on phone: Not on file    Gets together: Not on file    Attends religious service: Not on file    Active member of club or  organization: Not on file    Attends meetings of clubs or organizations:  Not on file    Relationship status: Not on file  . Intimate partner violence    Fear of current or ex partner: Not on file    Emotionally abused: Not on file    Physically abused: Not on file    Forced sexual activity: Not on file  Other Topics Concern  . 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  Enjoys running, mountain bike, kayak.     Review of Systems  All other systems reviewed and are negative.   PHYSICAL EXAMINATION:    BP 140/82   Pulse (!) 50   Temp (!) 97.2 F (36.2 C) (Temporal)   Ht 5\' 5"  (1.651 m)   Wt 135 lb 3.2 oz (61.3 kg)   LMP 07/07/2019 (Exact Date)   BMI 22.50 kg/m     General appearance: alert, cooperative and appears stated age   Pelvic US Uterus with 2 fibroids, largest 2.73 mm. (Four were measured at her last [place].) EMS 2.23 mm and no masses. Ovaries normal.  Right ovary with  7 x 9 mm paraovarian versus paratubal cyst. Second cystic area 15 x 6 mm cystic structure which appears to be a potential right hydrosalpinx.  Mild clear fluid next left adnexa.  ASSESSMENT  Uterine fibroids.  Stable (or even less seen today.) Endometrial polyp - not apparent.  Normal uterine bleeding.  Adnexal cyst - right paratubal versus para-ovarian and possible right hydrosalpinx.  PTSD.  PLAN  Reassurance regarding ultrasound findings today.  No need for operative care at this time.  No plan for routine follow up US.  Will follow patient clinically.  Support given for the traumatic event in her life.   An After Visit Summary was printed and given to the patient.  __15____ minutes face to face time of which over 50% was spent in counseling.

## 2019-07-22 DIAGNOSIS — F5105 Insomnia due to other mental disorder: Secondary | ICD-10-CM | POA: Diagnosis not present

## 2019-07-22 DIAGNOSIS — F411 Generalized anxiety disorder: Secondary | ICD-10-CM | POA: Diagnosis not present

## 2019-07-22 DIAGNOSIS — F322 Major depressive disorder, single episode, severe without psychotic features: Secondary | ICD-10-CM | POA: Diagnosis not present

## 2019-07-24 ENCOUNTER — Ambulatory Visit: Payer: Self-pay | Admitting: Obstetrics and Gynecology

## 2019-07-25 DIAGNOSIS — F4311 Post-traumatic stress disorder, acute: Secondary | ICD-10-CM | POA: Diagnosis not present

## 2019-08-06 DIAGNOSIS — F4311 Post-traumatic stress disorder, acute: Secondary | ICD-10-CM | POA: Diagnosis not present

## 2019-08-20 DIAGNOSIS — F4311 Post-traumatic stress disorder, acute: Secondary | ICD-10-CM | POA: Diagnosis not present

## 2019-09-03 DIAGNOSIS — F4311 Post-traumatic stress disorder, acute: Secondary | ICD-10-CM | POA: Diagnosis not present

## 2019-09-04 ENCOUNTER — Encounter: Payer: Self-pay | Admitting: Primary Care

## 2019-09-04 ENCOUNTER — Telehealth (INDEPENDENT_AMBULATORY_CARE_PROVIDER_SITE_OTHER): Payer: 59 | Admitting: Primary Care

## 2019-09-04 DIAGNOSIS — F411 Generalized anxiety disorder: Secondary | ICD-10-CM | POA: Diagnosis not present

## 2019-09-04 DIAGNOSIS — F419 Anxiety disorder, unspecified: Secondary | ICD-10-CM | POA: Diagnosis not present

## 2019-09-04 DIAGNOSIS — F5105 Insomnia due to other mental disorder: Secondary | ICD-10-CM | POA: Diagnosis not present

## 2019-09-04 DIAGNOSIS — E785 Hyperlipidemia, unspecified: Secondary | ICD-10-CM

## 2019-09-04 DIAGNOSIS — F322 Major depressive disorder, single episode, severe without psychotic features: Secondary | ICD-10-CM | POA: Diagnosis not present

## 2019-09-04 DIAGNOSIS — R002 Palpitations: Secondary | ICD-10-CM | POA: Insufficient documentation

## 2019-09-04 DIAGNOSIS — F329 Major depressive disorder, single episode, unspecified: Secondary | ICD-10-CM

## 2019-09-04 DIAGNOSIS — Z1231 Encounter for screening mammogram for malignant neoplasm of breast: Secondary | ICD-10-CM

## 2019-09-04 DIAGNOSIS — F32A Depression, unspecified: Secondary | ICD-10-CM

## 2019-09-04 HISTORY — DX: Palpitations: R00.2

## 2019-09-04 NOTE — Assessment & Plan Note (Signed)
Doing very well with psychiatry and therapy, exam today with significant provement.  Continue sertraline 150 mg and hydroxyzine 25 mg as needed.  I am happy to take over this medication regimen as she is stable.  She will talk this over with her psychiatrist.  Continue with therapy as scheduled.

## 2019-09-04 NOTE — Patient Instructions (Signed)
Continue sertraline 150 mg daily. Continue hydroxyzine 25 mg as needed.  Continue to meet with your therapist as discussed.  You will be contacted regarding your referral to cardiology.  Please let us know if you have not been contacted within two weeks.   You may call the [facility] in [place] for your mammogram and MRI.  Please call the main office for the lab appointment as discussed.  It was a pleasure to see you today! You look great! Allie Bossier, NP-C

## 2019-09-04 NOTE — Progress Notes (Signed)
Subjective:    Patient ID: Jessica Courser, MD, female    DOB: [birth year], 53 y.o.   MRN: [id number]  HPI  Virtual Visit via Video Note  I connected with Jessica Courser, MD on 09/04/19 at  2:40 PM EDT by a video enabled telemedicine application and verified that I am speaking with the correct person using two identifiers.  Location: Patient: Home Provider: Office   I discussed the limitations of evaluation and management by telemedicine and the availability of in person appointments. The patient expressed understanding and agreed to proceed.  History of Present Illness:  Jessica Dodson is a very pleasant 53 year old female with a history of anxiety disorder, ADHD, depression who presents today for follow up.  She is currently following with psychiatry, Dr. Nicolasa Ducking, and is managed on Zoloft 150 mg daily and hydroxyzine 25 mg for which she is taking 1-2 times daily on average.  She continues to follow with her therapist on a regular basis.    Overall doing very well since her last visit and feels well managed.  She may transition her medication regimen to our clinic.  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  Her prior role was contributing to the majority of her symptoms. She feels this has been a good change.  She would like to have a mammogram, also MRI of the breasts given severe densities of both breasts based off of recent guidelines.   She has also noticed palpitations over the last several months, mostly at rest.  Both her father and sister have a history of runs of V. tach.  She does not notice this when running, runs often and completed a 10K without difficulty this week.  She denies chest pain.    Observations/Objective:  Alert and oriented. Appears well, not sickly. No distress. Speaking in complete sentences.   Assessment and Plan:  See problem based charting.  Follow Up Instructions:   Continue sertraline 150 mg daily. Continue hydroxyzine 25 mg as needed.  Continue to meet with your therapist as discussed.  You will be contacted regarding your referral to cardiology.  Please let us know if you have not been contacted within two weeks.   You may call the [facility] Center in [place] for your mammogram and MRI.  Please call the main office for the lab appointment as discussed.  It was a pleasure to see you today! You look great! Allie Bossier, NP-C    I discussed the assessment and treatment plan with the patient. The patient was provided an opportunity to ask questions and all were answered. The patient agreed with the plan and demonstrated an understanding of the instructions.   The patient was advised to call back or seek an in-person evaluation if the symptoms worsen or if the condition fails to improve as anticipated.    Pleas Koch, NP    Review of Systems  Respiratory:       Some exertional shortness of breath when running  Cardiovascular: Positive for palpitations. Negative for chest pain.  Neurological: Negative for dizziness.  Psychiatric/Behavioral: The patient is not nervous/anxious.        See HPI        Past Medical History:  Diagnosis Date  . Abnormal uterine bleeding   . ADHD (attention deficit hyperactivity disorder)   . Allergy   . Anxiety   . Cold sore   .  Complication of anesthesia    hard to wake up after tonsillectomy and nasoseptoplasty  . Depression    recurrent, not on meds at this time  . Depression   . Fibroid   . H/O alcohol abuse    19 years sober  . Headache    migraine (once a year) usually has an aura  . Migraine with aura   . Pneumonia 2016  . Urinary incontinence      Social History   Socioeconomic History  . Marital status: Married    Spouse name: Not on file  . Number of children: 0  . Years of education: MD  . Highest education level: Not on file  Occupational History  . Not on file  Social  Needs  . Financial resource strain: Not on file  . Food insecurity    Worry: Not on file    Inability: Not on file  . Transportation needs    Medical: Not on file    Non-medical: Not on file  Tobacco Use  . Smoking status: Never Smoker  . Smokeless tobacco: Never Used  Substance and Sexual Activity  . Alcohol use: Never    Alcohol/week: 0.0 standard drinks    Frequency: Never    Comment: sober for 23 years  . Drug use: No  . Sexual activity: Yes    Birth control/protection: None  Lifestyle  . Physical activity    Days per week: Not on file    Minutes per session: Not on file  . Stress: Not on file  Relationships  . Social Herbalist on phone: Not on file    Gets together: Not on file    Attends religious service: Not on file    Active member of club or organization: Not on file    Attends meetings of clubs or organizations: Not on file    Relationship status: Not on file  . Intimate partner violence    Fear of current or ex partner: Not on file    Emotionally abused: Not on file    Physically abused: Not on file    Forced sexual activity: Not on file  Other Topics Concern  . 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  Enjoys running, mountain bike, kayak.     Past Surgical History:  Procedure Laterality Date  . BREAST BIOPSY Right   . BREAST CYST ASPIRATION Left   . BREAST SURGERY Right    biopsy - benign  . COLONOSCOPY    . LIPOMA EXCISION Left    6 cm tumor  . MASS EXCISION Left 05/08/2015   Procedure: EXCISION OF LEFT DELTOID MASS;  Surgeon: [name], MD;  Location: [facility];  Service: General;  Laterality: Left;  . NASAL SEPTUM SURGERY    . nevus removed     4 dysplastic nevi removed  . TONSILLECTOMY    .  urethral stricture surgery     at age 87    Family History  Problem Relation Age of Onset  . Obesity Mother   . High Cholesterol Mother   . Arthritis Mother   . Hyperlipidemia Mother   . Hypertension Mother   . High  Cholesterol Father   . Hypothyroidism Father   . Prostate cancer Father   . Hyperlipidemia Father   . Hypertension Father   . Cancer Father 26       Prostate ca  . Prostate cancer Maternal Uncle   . Colon cancer Maternal Uncle   . Breast cancer Paternal Aunt   . Arthritis Maternal Grandmother   . Stroke Maternal Grandmother   . Hypertension Maternal Grandmother   . Mental illness Maternal Grandmother   . Hypertension Maternal Grandfather   . Hyperlipidemia Paternal Grandmother   . Stroke Paternal Grandmother   . Prostate cancer Paternal Grandfather   . Hyperlipidemia Paternal Grandfather   . Heart disease Paternal Grandfather   . Hypertension Paternal Grandfather   . Hypothyroidism Paternal Grandfather   . Prostate cancer Maternal Uncle   . Breast cancer Paternal Aunt   . Diabetes Paternal Uncle     No Known Allergies  Current Outpatient Medications on File Prior to Visit  Medication Sig Dispense Refill  . aspirin 325 MG tablet Take 325 mg by mouth daily.    Marland Kitchen atorvastatin (LIPITOR) 20 MG tablet Take 1 tablet (20 mg total) by mouth every evening. For cholesterol. 90 tablet 2  . Cholecalciferol (VITAMIN D3) 5000 UNITS TABS Take 5,000 Units by mouth once a week.    . hydrOXYzine (VISTARIL) 25 MG capsule Take 1 capsule by mouth 3 (three) times daily as needed.    . sertraline (ZOLOFT) 100 MG tablet Take 1 tablet by mouth daily.    . sertraline (ZOLOFT) 50 MG tablet Take 1 tablet (50 mg total) by mouth daily. For anxiety. 90 tablet 0  . valACYclovir (VALTREX) 1000 MG tablet TAKE 2 TABLETS BY MOUTH TWICE DAILY FOR 1 DAY AS NEEDED FOR BREAKOUTS. 30 tablet 3  . vitamin B-12 (CYANOCOBALAMIN) 500 MCG tablet Take 500 mcg by mouth once a week.      No current  facility-administered medications on file prior to visit.     There were no vitals taken for this visit.   Objective:   Physical Exam  Constitutional: She is oriented to person, place, and time. She appears well-nourished.  Respiratory: Effort normal.  Neurological: She is alert and oriented to person, place, and time.  Psychiatric: She has a normal mood and affect.           Assessment & Plan:

## 2019-09-04 NOTE — Assessment & Plan Note (Signed)
Intermittent over the last couple of months. Given that she is irregular runner, I believe it to be wise for cardiology evaluation with perhaps Holter monitor.  Family history of ventricular tachycardia in both father and sister.  Labs pending including CBC, TSH, CMP. Referral to cardiology placed.

## 2019-09-05 ENCOUNTER — Other Ambulatory Visit: Payer: Self-pay | Admitting: Primary Care

## 2019-09-05 DIAGNOSIS — Z1239 Encounter for other screening for malignant neoplasm of breast: Secondary | ICD-10-CM

## 2019-09-06 DIAGNOSIS — Z23 Encounter for immunization: Secondary | ICD-10-CM | POA: Diagnosis not present

## 2019-09-09 ENCOUNTER — Ambulatory Visit (INDEPENDENT_AMBULATORY_CARE_PROVIDER_SITE_OTHER): Payer: 59 | Admitting: Cardiology

## 2019-09-09 ENCOUNTER — Encounter: Payer: Self-pay | Admitting: Cardiology

## 2019-09-09 ENCOUNTER — Other Ambulatory Visit: Payer: Self-pay

## 2019-09-09 VITALS — BP 130/82 | HR 69 | Ht 65.0 in

## 2019-09-09 DIAGNOSIS — R002 Palpitations: Secondary | ICD-10-CM | POA: Diagnosis not present

## 2019-09-09 NOTE — Progress Notes (Signed)
Cardiology Office Note    Date:  09/09/2019   ID:  Arnetha Courser, MD, DOB Mar 21, 1966, MRN [id number]  PCP:  Pleas Koch, NP  Cardiologist:  Fransico Him, MD   Chief Complaint  Patient presents with  . 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 She is an avid runner and has been noticing palpitations for some time.  She has a family hx of arrhythmias.  Her mom was dx with afib, her father has NSVT and her sister has NSVT.  There is no fm hx of DCM or CHF that she is aware of.  She denies any chest pain, SOB, DOE, PND, orthopnea or LE edema.  She has not had any syncope.    Past Medical History:  Diagnosis Date  . Abnormal uterine bleeding   . ADHD 12/07/2016  . ADHD (attention deficit hyperactivity disorder)   . Allergy   . Anxiety   . Anxiety and depression 06/30/2015  . Breast mass in female 10/12/2015  . Cold sore   . Complication of anesthesia    hard to wake up after tonsillectomy and nasoseptoplasty  . Depression    recurrent, not on meds at this time  . Depression   . Essential tremor 07/21/2016  . Fibroid   . H/O alcohol abuse    19 years sober  . Headache    migraine (once a year) usually has an aura  . Herpes labialis 02/24/2016  . Hyperlipidemia LDL goal <70 09/12/2018  . Left upper extremity numbness 09/12/2018  . Migraine with aura   . Palpitations 09/04/2019  . Pneumonia 2016  . Rash and nonspecific skin eruption 11/12/2018  . Superficial vein thrombosis 09/12/2018  . TIA (transient ischemic attack) 09/03/2018  . Urinary incontinence     Past Surgical History:  Procedure Laterality Date  . BREAST BIOPSY Right   . BREAST CYST ASPIRATION Left   . BREAST SURGERY Right    biopsy - benign  .  COLONOSCOPY    . LIPOMA EXCISION Left    6 cm tumor  . MASS EXCISION Left 05/08/2015   Procedure: EXCISION OF LEFT DELTOID MASS;  Surgeon: [name], MD;  Location: [facility];  Service: General;  Laterality: Left;  . NASAL SEPTUM SURGERY    . nevus removed     4 dysplastic nevi removed  . TONSILLECTOMY    . urethral stricture surgery     at age 41    Current Medications: Current Meds  Medication Sig  . aspirin 325 MG tablet Take 325 mg by mouth daily.  Marland Kitchen atorvastatin (LIPITOR) 20 MG tablet Take 1 tablet (20 mg total) by mouth every evening. For cholesterol.  . Cholecalciferol (VITAMIN D3) 5000 UNITS TABS Take 5,000 Units by mouth once a week.  . hydrOXYzine (VISTARIL) 25 MG capsule Take 1 capsule by mouth 3 (three) times daily as needed.  . sertraline (ZOLOFT) 100 MG tablet Take 1 tablet by mouth daily.  . sertraline (ZOLOFT) 50 MG tablet Take 1 tablet (50 mg total) by mouth daily. For anxiety.  . valACYclovir (VALTREX) 1000 MG tablet TAKE 2 TABLETS BY MOUTH TWICE DAILY FOR 1 DAY AS NEEDED FOR BREAKOUTS.  . vitamin  B-12 (CYANOCOBALAMIN) 500 MCG tablet Take 500 mcg by mouth once a week.     Allergies:   Patient has no known allergies.   Social History   Socioeconomic History  . Marital status: Married    Spouse name: Not on file  . Number of children: 0  . Years of education: MD  . Highest education level: Not on file  Occupational History  . Not on file  Social Needs  . Financial resource strain: Not on file  . Food insecurity    Worry: Not on file    Inability: Not on file  . Transportation needs    Medical: Not on file    Non-medical: Not on file  Tobacco Use  . Smoking status: Never Smoker  . Smokeless tobacco: Never Used  Substance and Sexual Activity  . Alcohol use: Never    Alcohol/week: 0.0 standard drinks    Frequency: Never    Comment: sober for 23 years  . Drug use: No  . Sexual activity: Yes    Birth control/protection: None  Lifestyle  . Physical  activity    Days per week: Not on file    Minutes per session: Not on file  . Stress: Not on file  Relationships  . Social Herbalist on phone: Not on file    Gets together: Not on file    Attends religious service: Not on file    Active member of club or organization: Not on file    Attends meetings of clubs or organizations: Not on file    Relationship status: Not on file  Other Topics Concern  . Not on file  Social History Narrative   ** Merged History Encounter **       ** Data from: 05/12/15 Enc Dept: [facility]       ** Data from: 06/30/15 Enc Dept: [facility]   Married. 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  Live in [place].  Enjoys running, mountain bike, kayak.      Family History:  The patient's family history includes Arthritis in her maternal grandmother and mother; Breast cancer in her paternal aunt and paternal aunt; Cancer (age of onset: 66) in her father; Colon cancer in her maternal uncle; Diabetes in her paternal uncle; Heart disease in her paternal grandfather; High Cholesterol in her father and mother; Hyperlipidemia in her father, mother, paternal grandfather, and paternal grandmother; Hypertension in her father, maternal grandfather, maternal grandmother, mother, and paternal grandfather; Hypothyroidism in her father and paternal grandfather; Mental illness in her maternal grandmother; Obesity in her mother; Prostate cancer in her father, maternal uncle, maternal uncle, and paternal grandfather; Stroke in her maternal grandmother and paternal grandmother.   ROS:   Please see the history of present illness.    ROS All other systems reviewed and are negative.  No flowsheet data found.     PHYSICAL EXAM:   VS:  BP 130/82   Pulse 69   Ht 5\' 5"  (1.651 m)   BMI 22.50 kg/m    GEN: Well nourished, well developed, in no acute distress  HEENT: normal  Neck: no JVD, carotid bruits, or masses Cardiac: RRR; no  murmurs, rubs, or gallops,no edema.  Intact distal pulses bilaterally.  Respiratory:  clear to auscultation bilaterally, normal work of breathing GI: soft, nontender, nondistended, + BS MS: no deformity or atrophy  Skin: warm and dry, no rash Neuro:  Alert and Oriented x 3, Strength and  sensation are intact Psych: euthymic mood, full affect  Wt Readings from Last 3 Encounters:  07/18/19 135 lb 3.2 oz (61.3 kg)  01/17/19 133 lb 9.6 oz (60.6 kg)  12/19/18 130 lb 6.4 oz (59.1 kg)      Studies/Labs Reviewed:   EKG:  EKG is ordered today.  The ekg ordered today demonstrates NSR with possible biatrial enlargement and normal intervals  Recent Labs: 09/10/2018: BUN 9; Creatinine, Ser 0.68; Hemoglobin 13.0; Platelets 256; Potassium 3.5; Sodium 137 10/24/2018: ALT 16 12/19/2018: TSH 1.300   Lipid Panel    Component Value Date/Time   CHOL 117 10/24/2018 1405   TRIG 57.0 10/24/2018 1405   HDL 54.70 10/24/2018 1405   CHOLHDL 2 10/24/2018 1405   VLDL 11.4 10/24/2018 1405   LDLCALC 51 10/24/2018 1405    Additional studies/ records that were reviewed today include:  Office notes from PCP    ASSESSMENT:    1. Palpitations      PLAN:  In order of problems listed above:  1. Palpitations -etiology uncertain -she does have a fm hx of both PAF(mom) and NSVT (father and sister) -she only drinks 1-2 cups of caffeine a day and no OTC stimulants -EKG is normal with normal QTc -no fam hx of RV dysplasia, HOCM or other DCM -will get event monitor to assess for arrhythmias    Medication Adjustments/Labs and Tests Ordered: Current medicines are reviewed at length with the patient today.  Concerns regarding medicines are outlined above.  Medication changes, Labs and Tests ordered today are listed in the Patient Instructions below.  Patient Instructions  Medication Instructions:  Your provider recommends that you continue on your current medications as directed. Please refer to the  Current Medication list given to you today.     Testing/Procedures: Your physician has recommended that you wear an event monitor. Event monitors are medical devices that record the heart's electrical activity. Doctors most often [place] these monitors to diagnose arrhythmias. Arrhythmias are problems with the speed or rhythm of the heartbeat. The monitor is a small, portable device. You can wear one while you do your normal daily activities. This is usually used to diagnose what is causing palpitations/syncope (passing out).    Follow-Up: Your provider recommends that you schedule a follow-up appointment AS NEEDED pending study results.     Signed, Fransico Him, MD  09/09/2019 4:16 PM    [facility] [address], [place], [place]  [address] Phone: [phone]; Fax: [phone]

## 2019-09-09 NOTE — Patient Instructions (Signed)
Medication Instructions:  Your provider recommends that you continue on your current medications as directed. Please refer to the Current Medication list given to you today.     Testing/Procedures: Your physician has recommended that you wear an event monitor. Event monitors are medical devices that record the heart's electrical activity. Doctors most often [place] these monitors to diagnose arrhythmias. Arrhythmias are problems with the speed or rhythm of the heartbeat. The monitor is a small, portable device. You can wear one while you do your normal daily activities. This is usually used to diagnose what is causing palpitations/syncope (passing out).    Follow-Up: Your provider recommends that you schedule a follow-up appointment AS NEEDED pending study results.

## 2019-09-11 ENCOUNTER — Telehealth: Payer: Self-pay

## 2019-09-11 NOTE — Telephone Encounter (Signed)
LM with monitor instructions. 30 day [employer] Event monitor ordered.

## 2019-09-18 ENCOUNTER — Ambulatory Visit (INDEPENDENT_AMBULATORY_CARE_PROVIDER_SITE_OTHER): Payer: 59

## 2019-09-18 DIAGNOSIS — R002 Palpitations: Secondary | ICD-10-CM | POA: Diagnosis not present

## 2019-09-23 MED FILL — ATORVASTATIN 20 MG TABLET: 20 | 90 days supply | Qty: 90 | Fill #1

## 2019-09-23 MED FILL — SERTRALINE HCL 100 MG TAB: 100 | 90 days supply | Qty: 135 | Fill #1

## 2019-09-23 MED FILL — HYDROXYZINE PAMOATE 25 MG C: 25 | 90 days supply | Qty: 270 | Fill #1

## 2019-09-24 DIAGNOSIS — F4311 Post-traumatic stress disorder, acute: Secondary | ICD-10-CM | POA: Diagnosis not present

## 2019-10-22 DIAGNOSIS — F4311 Post-traumatic stress disorder, acute: Secondary | ICD-10-CM | POA: Diagnosis not present

## 2019-11-05 ENCOUNTER — Telehealth: Payer: Self-pay

## 2019-11-05 DIAGNOSIS — R002 Palpitations: Secondary | ICD-10-CM

## 2019-11-05 DIAGNOSIS — E785 Hyperlipidemia, unspecified: Secondary | ICD-10-CM

## 2019-11-05 MED ORDER — METOPROLOL SUCCINATE ER 25 MG PO TB24: 25.0000 mg | ORAL_TABLET | Freq: Every day | ORAL | 3 refills | Status: AC

## 2019-11-05 MED ORDER — METOPROLOL TARTRATE 50 MG PO TABS: 50.0000 mg | ORAL_TABLET | Freq: Once | ORAL | 0 refills | Status: AC

## 2019-11-05 MED FILL — METOPROLOL SUCCINATE ER 25: 25 | 90 days supply | Qty: 90 | Fill #0

## 2019-11-05 MED FILL — METOPROLOL TARTRATE 50 MG T: 50 | 1 days supply | Qty: 1 | Fill #0

## 2019-11-05 NOTE — Telephone Encounter (Signed)
-----   Message from Jessica Margarita, MD sent at 10/25/2019 12:28 PM EST ----- Heart monitor showed Normal rhythm with occasional extra heart beats from the top and bottom of her heart some of which occurred in short bursts.  Please have her come in for a BMET, Mag and TSH.  Please repeat 2D echo to make sure heart function is normal. Given NSVT please get a coronary CTA to rule out CAD.  Give Lopressor tartrate  50mg  PO 2 hours before her CTA.  I would like her to start Toprol XL 25mg  daily for suppression of arrhythmias and followup with me the first full week of January virtually.

## 2019-11-05 NOTE — Telephone Encounter (Signed)
Spoke with patient and went over recommendations from Dr. Radford Pax. Patient agreed and orders have been placed.

## 2019-11-11 ENCOUNTER — Ambulatory Visit
Admission: RE | Admit: 2019-11-11 | Discharge: 2019-11-11 | Disposition: A | Discharge status: Home / Self Care | Payer: 59 | Source: Ambulatory Visit | Attending: Primary Care | Admitting: Primary Care

## 2019-11-11 ENCOUNTER — Other Ambulatory Visit: Payer: Self-pay

## 2019-11-11 DIAGNOSIS — Z1231 Encounter for screening mammogram for malignant neoplasm of breast: Secondary | ICD-10-CM

## 2019-11-14 ENCOUNTER — Other Ambulatory Visit: Payer: 59

## 2019-11-14 ENCOUNTER — Other Ambulatory Visit (HOSPITAL_COMMUNITY): Payer: 59

## 2019-11-14 ENCOUNTER — Telehealth (HOSPITAL_COMMUNITY): Payer: Self-pay | Admitting: Radiology

## 2019-11-14 NOTE — Telephone Encounter (Signed)
Patient does not want test at this time.

## 2019-11-14 NOTE — Telephone Encounter (Signed)
I think she wants to wait until COVID is done

## 2020-04-07 ENCOUNTER — Telehealth: Payer: Self-pay | Admitting: Cardiology

## 2020-04-07 DIAGNOSIS — R002 Palpitations: Secondary | ICD-10-CM

## 2020-04-07 NOTE — Telephone Encounter (Signed)
She had NSVT on her vent monitor so would avoid Lamictal.  Her QTc is normal on EKG and echo normal so I think Seroquel is ok if EKG for QTc is monitored.  SHe never had her coronary CTA done.  Given the NSVT on heart monitor would at least recommend an ETT - please schedule

## 2020-04-07 NOTE — Telephone Encounter (Signed)
New message   Patient would like to know if she can discuss about the risk of two new medications and her heart condition. Please call to discuss.

## 2020-04-07 NOTE — Telephone Encounter (Signed)
Patient states that her psychiatrist has recently recommended that she start taking Lamictal and Seroquel. She states that she had seen that both have cardiac precautions and is concerned about possibility of causing arrhythmias with her history. States that she just wants to make sure the benefits of these medications outweighs the risks before starting on them. Advised patient that I would check with Dr. Radford Pax and our pharmacist and get back with her.

## 2020-04-08 NOTE — Telephone Encounter (Signed)
Spoke with the patient and made her aware of Dr. Theodosia Blender recommendations with medications. Patient verbalized understanding. Patient states that she is willing to go through with ETT now however her insurance does not cover [facility] anymore. She is going to check with her insurance company to see how much it would be and whether she would need to get it done somewhere else. She will let me know what she finds out.

## 2020-04-08 NOTE — Addendum Note (Signed)
Addended by: Antonieta Iba on: 04/08/2020 09:56 AM   Modules accepted: Orders

## 2020-04-09 NOTE — Addendum Note (Signed)
Addended by: Antonieta Iba on: 04/09/2020 11:33 AM   Modules accepted: Orders

## 2020-04-29 ENCOUNTER — Telehealth: Payer: Self-pay | Admitting: Cardiology

## 2020-04-29 NOTE — Telephone Encounter (Signed)
Spoke with Dr. Sanda Klein, on 6/11 to schedule her for the  GXT that was ordered.    She needed to to check with the insurance to see if it was in network.  Call today to follow up and she will not be able to get the test done here.  She stated her insurance is not in network.    She want to know if Dr. Radford Pax,  will know a MD in the [facility] network that she  will be able to follow up and get the test done.   I will closed the order.

## 2020-04-30 NOTE — Telephone Encounter (Signed)
I dont know anyone

## 2020-08-05 ENCOUNTER — Telehealth: Payer: Self-pay | Admitting: Primary Care

## 2020-08-05 DIAGNOSIS — D239 Other benign neoplasm of skin, unspecified: Secondary | ICD-10-CM

## 2020-08-05 NOTE — Telephone Encounter (Signed)
Pt called wanting to get a referral to [facility]  [phone]   she has Dysplastic nevi  syndrom 3 [facility]    And is very worried

## 2020-08-05 NOTE — Telephone Encounter (Signed)
Referral has been sent.

## 2020-08-05 NOTE — Telephone Encounter (Signed)
Noted, urgent referral placed to [facility]. FYI Bro[facility].

## 2020-11-30 ENCOUNTER — Other Ambulatory Visit: Payer: Commercial Managed Care - PPO
